# Patient Record
Sex: Female | Born: 1935 | Race: White | Hispanic: No | Smoking: Former smoker
Health system: Southern US, Community
[De-identification: ages and names within clinical notes are randomized; demographics above are authoritative.]

## PROBLEM LIST (undated history)

## (undated) DIAGNOSIS — R413 Other amnesia: Secondary | ICD-10-CM

## (undated) DIAGNOSIS — Z9289 Personal history of other medical treatment: Secondary | ICD-10-CM

## (undated) DIAGNOSIS — K219 Gastro-esophageal reflux disease without esophagitis: Secondary | ICD-10-CM

## (undated) DIAGNOSIS — Z972 Presence of dental prosthetic device (complete) (partial): Secondary | ICD-10-CM

## (undated) DIAGNOSIS — Z86010 Personal history of colonic polyps: Secondary | ICD-10-CM

## (undated) DIAGNOSIS — G309 Alzheimer's disease, unspecified: Secondary | ICD-10-CM

## (undated) DIAGNOSIS — IMO0001 Reserved for inherently not codable concepts without codable children: Secondary | ICD-10-CM

## (undated) DIAGNOSIS — M810 Age-related osteoporosis without current pathological fracture: Secondary | ICD-10-CM

## (undated) DIAGNOSIS — Z8719 Personal history of other diseases of the digestive system: Secondary | ICD-10-CM

## (undated) DIAGNOSIS — E785 Hyperlipidemia, unspecified: Secondary | ICD-10-CM

## (undated) DIAGNOSIS — F0280 Dementia in other diseases classified elsewhere without behavioral disturbance: Secondary | ICD-10-CM

## (undated) DIAGNOSIS — I2699 Other pulmonary embolism without acute cor pulmonale: Secondary | ICD-10-CM

## (undated) HISTORY — PX: HEMORROIDECTOMY: SUR656

## (undated) HISTORY — DX: Hyperlipidemia, unspecified: E78.5

## (undated) HISTORY — DX: Personal history of other medical treatment: Z92.89

## (undated) HISTORY — DX: Other pulmonary embolism without acute cor pulmonale: I26.99

## (undated) HISTORY — DX: Personal history of other diseases of the digestive system: Z87.19

## (undated) HISTORY — DX: Age-related osteoporosis without current pathological fracture: M81.0

## (undated) HISTORY — PX: APPENDECTOMY: SHX54

## (undated) HISTORY — DX: Other amnesia: R41.3

## (undated) HISTORY — DX: Reserved for inherently not codable concepts without codable children: IMO0001

## (undated) HISTORY — DX: Personal history of colonic polyps: Z86.010

---

## 1997-08-30 ENCOUNTER — Ambulatory Visit (HOSPITAL_COMMUNITY): Admission: RE | Admit: 1997-08-30 | Discharge: 1997-08-30 | Payer: Self-pay | Admitting: Gastroenterology

## 1998-10-14 ENCOUNTER — Other Ambulatory Visit: Admission: RE | Admit: 1998-10-14 | Discharge: 1998-10-14 | Payer: Self-pay | Admitting: Family Medicine

## 2000-12-15 ENCOUNTER — Other Ambulatory Visit: Admission: RE | Admit: 2000-12-15 | Discharge: 2000-12-15 | Payer: Self-pay | Admitting: Family Medicine

## 2001-02-08 ENCOUNTER — Ambulatory Visit (HOSPITAL_COMMUNITY): Admission: RE | Admit: 2001-02-08 | Discharge: 2001-02-08 | Payer: Self-pay | Admitting: Gastroenterology

## 2001-02-08 ENCOUNTER — Encounter (INDEPENDENT_AMBULATORY_CARE_PROVIDER_SITE_OTHER): Payer: Self-pay | Admitting: *Deleted

## 2003-02-28 ENCOUNTER — Other Ambulatory Visit: Admission: RE | Admit: 2003-02-28 | Discharge: 2003-02-28 | Payer: Self-pay | Admitting: Family Medicine

## 2004-05-19 ENCOUNTER — Ambulatory Visit (HOSPITAL_COMMUNITY): Admission: RE | Admit: 2004-05-19 | Discharge: 2004-05-19 | Payer: Self-pay | Admitting: Gastroenterology

## 2008-03-08 ENCOUNTER — Encounter: Payer: Self-pay | Admitting: Family Medicine

## 2009-06-04 ENCOUNTER — Encounter: Payer: Self-pay | Admitting: Cardiology

## 2009-06-05 DIAGNOSIS — Z9289 Personal history of other medical treatment: Secondary | ICD-10-CM | POA: Insufficient documentation

## 2009-06-05 HISTORY — DX: Personal history of other medical treatment: Z92.89

## 2009-09-19 ENCOUNTER — Encounter: Payer: Self-pay | Admitting: Family Medicine

## 2010-01-19 ENCOUNTER — Encounter: Payer: Self-pay | Admitting: Family Medicine

## 2010-03-03 ENCOUNTER — Encounter: Payer: Self-pay | Admitting: Family Medicine

## 2010-03-03 LAB — HM COLONOSCOPY

## 2010-05-19 ENCOUNTER — Ambulatory Visit
Admission: RE | Admit: 2010-05-19 | Discharge: 2010-05-19 | Payer: Self-pay | Source: Home / Self Care | Attending: Family Medicine | Admitting: Family Medicine

## 2010-05-19 ENCOUNTER — Other Ambulatory Visit: Payer: Self-pay | Admitting: Family Medicine

## 2010-05-19 DIAGNOSIS — R413 Other amnesia: Secondary | ICD-10-CM | POA: Insufficient documentation

## 2010-05-19 DIAGNOSIS — E785 Hyperlipidemia, unspecified: Secondary | ICD-10-CM

## 2010-05-19 DIAGNOSIS — Z860100 Personal history of colon polyps, unspecified: Secondary | ICD-10-CM | POA: Insufficient documentation

## 2010-05-19 DIAGNOSIS — Z8719 Personal history of other diseases of the digestive system: Secondary | ICD-10-CM | POA: Insufficient documentation

## 2010-05-19 DIAGNOSIS — M810 Age-related osteoporosis without current pathological fracture: Secondary | ICD-10-CM

## 2010-05-19 HISTORY — DX: Hyperlipidemia, unspecified: E78.5

## 2010-05-19 HISTORY — DX: Age-related osteoporosis without current pathological fracture: M81.0

## 2010-05-19 LAB — LIPID PANEL
Cholesterol: 169 mg/dL (ref 0–200)
HDL: 54.8 mg/dL (ref >39.00)
LDL Cholesterol: 100 mg/dL — ABNORMAL HIGH (ref 0–99)
Total CHOL/HDL Ratio: 3
Triglycerides: 71 mg/dL (ref 0.0–149.0)
VLDL: 14.2 mg/dL (ref 0.0–40.0)

## 2010-05-19 LAB — HEPATIC FUNCTION PANEL
ALT: 18 U/L (ref 0–35)
AST: 23 U/L (ref 0–37)
Albumin: 3.8 g/dL (ref 3.5–5.2)
Alkaline Phosphatase: 44 U/L (ref 39–117)
Bilirubin, Direct: 0.1 mg/dL (ref 0.0–0.3)
Total Bilirubin: 0.7 mg/dL (ref 0.3–1.2)
Total Protein: 6.7 g/dL (ref 6.0–8.3)

## 2010-05-19 LAB — BASIC METABOLIC PANEL
BUN: 15 mg/dL (ref 6–23)
CO2: 29 mEq/L (ref 19–32)
Calcium: 9.6 mg/dL (ref 8.4–10.5)
Chloride: 105 mEq/L (ref 96–112)
Creatinine, Ser: 1 mg/dL (ref 0.4–1.2)
GFR: 56.13 mL/min — ABNORMAL LOW (ref >60.00)
Glucose, Bld: 93 mg/dL (ref 70–99)
Potassium: 4.1 mEq/L (ref 3.5–5.1)
Sodium: 141 mEq/L (ref 135–145)

## 2010-06-04 ENCOUNTER — Encounter: Payer: Self-pay | Admitting: Family Medicine

## 2010-06-11 ENCOUNTER — Encounter: Payer: Self-pay | Admitting: Family Medicine

## 2010-06-12 NOTE — Assessment & Plan Note (Addendum)
Summary: NEW MEDICARE PT   Vital Signs:  Patient profile:   77 year old female Height:      63.75 inches Weight:      172.25 pounds BMI:     29.91 Temp:     98 degrees F oral Pulse rate:   80 / minute Pulse rhythm:   regular BP sitting:   144 / 84  (left arm) Cuff size:   regular  Vitals Entered By: Delilah Shan CMA  Dull) (May 19, 2010 10:02 AM) CC: New Medicare Patient to Establish   History of Present Illness: Needs to est with new MD.   Elevated Cholesterol: Using medications without problems:yes Muscle aches: no Other complaints:no  Osteoporosis- On evista and tolerating this well.  Requesting records.    Memory loss- prev started on donepezil and has done well.  Dec in forgetfulness per patient.  Living alone and independent with activity/driving/cooking.  2/3 on recall today for short term memory.  O/w recall here to day is normal (long term dates).  No motor changes per patient.   Preventive Screening-Counseling & Management  Alcohol-Tobacco     Smoking Status: quit  Caffeine-Diet-Exercise     Does Patient Exercise: yes      Drug Use:  no.    Allergies (verified): No Known Drug Allergies  Past History:  Past Medical History: HYPERLIPIDEMIA (ICD-272.4) DIVERTICULITIS, HX OF (ICD-V12.79) COLONIC POLYPS, HX OF (ICD-V12.72) MEMORY LOSS (ICD-780.93) OSTEOPOROSIS (ICD-733.00)  Past Surgical History: Appendectomy Hemorrhoidectomy  Family History: Reviewed history and no changes required. Family History Hypertension all sibs dead, all sisters, last one died 10/06/2009 1 sib with CVA, 1 with brain tumor, 1 with PNA Mother dead, HTN Father dead, melanoma  Social History: Reviewed history and no changes required. Retired from [employer], Designer, television/film set From [place], [place] Education:  2022/10/07 Widow as of Oct 06, 2009, lives alone Former Smoker, quit in 10/07/78 Alcohol use-no Drug use-no Regular exercise-yes, [employer] enjoys crafts, bowling, church/senior activity 1  son, JohnSmoking Status:  quit Drug Use:  no Does Patient Exercise:  yes  Review of Systems       See HPI.  Otherwise negative.    Physical Exam  General:  GEN: nad, alert and oriented, pleasant HEENT: mucous membranes moist NECK: supple w/o LA CV: rrr PULM: ctab, no inc wob ABD: soft, +bs EXT: no edema SKIN: no acute rash    Impression & Recommendations:  Problem # 1:  MEMORY LOSS (ICD-780.93) No change in meds.  Appears to be stable.  Requesting records.    Problem # 2:  OSTEOPOROSIS (ICD-733.00) No change in meds.  Requesting records.   Her updated medication list for this problem includes:    Evista 60 Mg Tabs (Raloxifene hcl) .Marland Kitchen... Take 1 tablet by mouth once a day    Vitamin D 400 Unit Tabs (Cholecalciferol) ..... Softgel 1.25 mg once daily    Calcium Carbonate-vitamin D 600-400 Mg-unit Tabs (Calcium carbonate-vitamin d) .Marland Kitchen... Take 1 tablet by mouth two times a day  Problem # 3:  HYPERLIPIDEMIA (ICD-272.4) No change in meds.  See notes on labs.  Her updated medication list for this problem includes:    Lipitor 20 Mg Tabs (Atorvastatin calcium) .Marland Kitchen... Take 1 tablet by mouth once a day    Lovaza 1 Gm Caps (Omega-3-acid ethyl esters) .Marland Kitchen... Take 2 capsules two times a day  Orders: TLB-BMP (Basic Metabolic Panel-BMET) (80048-METABOL) TLB-Hepatic/Liver Function Pnl (80076-HEPATIC) TLB-Lipid Panel (80061-LIPID)  Complete Medication List: 1)  Donepezil Hcl 10 Mg Tabs (Donepezil  hcl) .... Take 1 tablet by mouth once a day 2)  Lipitor 20 Mg Tabs (Atorvastatin calcium) .... Take 1 tablet by mouth once a day 3)  Omeprazole 20 Mg Cpdr (Omeprazole) .... Take 1 tablet by mouth once a day 4)  Evista 60 Mg Tabs (Raloxifene hcl) .... Take 1 tablet by mouth once a day 5)  Vitamin B-12 500 Mcg Tabs (Cyanocobalamin) .Marland Kitchen.. 100 mg. once daily 6)  Vitamin D 400 Unit Tabs (Cholecalciferol) .... Softgel 1.25 mg once daily 7)  Lovaza 1 Gm Caps (Omega-3-acid ethyl esters) .... Take 2  capsules two times a day 8)  Multivitamins Tabs (Multiple vitamin) .... Once daily 9)  Calcium Carbonate-vitamin D 600-400 Mg-unit Tabs (Calcium carbonate-vitamin d) .... Take 1 tablet by mouth two times a day 10)  Potassium Gluconate 2 Meq Tabs (Potassium gluconate) .... Once daily 11)  Glucosamine-chondroitin Caps (Glucosamine-chondroit-vit c-mn) .... 2,000 mg. once daily 12)  Vitamin C 500 Mg Tabs (Ascorbic acid) .... 1,000 mg. once daily 13)  Stool Softener 100 Mg Caps (Docusate sodium) .... Once daily  Patient Instructions: 1)  Let us know when you need refills on your medicines.  We'll request your records in the meantime.  We'll contact you with your lab report.  2)  Plan on 6 month OV, with fasting cmet/lipid (272.0) and TSH/vitamin D (733.0) before the visit. 3)  Take care.  Glad to see you today.  Prescriptions: POTASSIUM GLUCONATE 2 MEQ TABS (POTASSIUM GLUCONATE) once daily  #90 x 3   Entered and Authorized by:   Crawford Givens MD   Signed by:   Crawford Givens MD on 05/19/2010   Method used:   Print then Give to Patient   RxID:   [id number] LOVAZA 1 GM CAPS (OMEGA-3-ACID ETHYL ESTERS) Take 2 capsules two times a day  #360 x 3   Entered and Authorized by:   Crawford Givens MD   Signed by:   Crawford Givens MD on 05/19/2010   Method used:   Print then Give to Patient   RxID:   [id number] EVISTA 60 MG TABS (RALOXIFENE HCL) Take 1 tablet by mouth once a day  #90 x 3   Entered and Authorized by:   Crawford Givens MD   Signed by:   Crawford Givens MD on 05/19/2010   Method used:   Print then Give to Patient   RxID:   [id number] OMEPRAZOLE 20 MG CPDR (OMEPRAZOLE) Take 1 tablet by mouth once a day  #90 x 3   Entered and Authorized by:   Crawford Givens MD   Signed by:   Crawford Givens MD on 05/19/2010   Method used:   Print then Give to Patient   RxID:   [id number] LIPITOR 20 MG TABS (ATORVASTATIN CALCIUM) Take 1 tablet by mouth once a day  #90 x 3   Entered and  Authorized by:   Crawford Givens MD   Signed by:   Crawford Givens MD on 05/19/2010   Method used:   Print then Give to Patient   RxID:   [id number] DONEPEZIL HCL 10 MG TABS (DONEPEZIL HCL) Take 1 tablet by mouth once a day  #90 x 3   Entered and Authorized by:   Crawford Givens MD   Signed by:   Crawford Givens MD on 05/19/2010   Method used:   Print then Give to Patient   RxID:   [id number]    Orders Added: 1)  New Patient Level III [09323] 2)  TLB-BMP (  Basic Metabolic Panel-BMET) [80048-METABOL] 3)  TLB-Hepatic/Liver Function Pnl [80076-HEPATIC] 4)  TLB-Lipid Panel [80061-LIPID]    Current Allergies (reviewed today): No known allergies      Preventive Care Screening  Colonoscopy:    Date:  04/10/2010    Results:  Done    Appended Document: NEW MEDICARE PT    Clinical Lists Changes  Observations: Added new observation of PAST MED HX: HYPERLIPIDEMIA (ICD-272.4) DIVERTICULITIS, HX OF (ICD-V12.79) COLONIC POLYPS, HX OF (ICD-V12.72) MEMORY LOSS (ICD-780.93) OSTEOPOROSIS (ICD-733.00)- lack of response to boniva, started on evista 12/11, due for repeat DXA 2013  (06/04/2010 11:58) Added new observation of DM PROGRESS: N/A (06/04/2010 11:58) Added new observation of DM FSREVIEW: N/A (06/04/2010 11:58) Added new observation of HTN PROGRESS: N/A (06/04/2010 11:58) Added new observation of HTN FSREVIEW: N/A (06/04/2010 11:58)         Past History:  Past Medical History: HYPERLIPIDEMIA (ICD-272.4) DIVERTICULITIS, HX OF (ICD-V12.79) COLONIC POLYPS, HX OF (ICD-V12.72) MEMORY LOSS (ICD-780.93) OSTEOPOROSIS (ICD-733.00)- lack of response to boniva, started on evista 12/11, due for repeat DXA 2013

## 2010-06-12 NOTE — Letter (Signed)
Summary: [facility]  [facility]   Imported By: Maryln Gottron 06/02/2010 13:53:10  _____________________________________________________________________  External Attachment:    Type:   Image     Comment:   External Document  Appended Document: [facility] See if you can get the actual colonoscopy report.  Thanks.   Appended Document: [facility] Requested by fax.  Appended Document: [facility] Reports have arrived, in your in box.  Appended Document: [facility] will review.

## 2010-06-12 NOTE — Letter (Signed)
Summary: Records from [facility] 2008 - 2009  Records from [facility] 2008 - 2009   Imported By: Maryln Gottron 05/30/2010 14:55:39  _____________________________________________________________________  External Attachment:    Type:   Image     Comment:   External Document

## 2010-06-18 NOTE — Miscellaneous (Signed)
Clinical Lists Changes  Observations: Added new observation of COLONNXTDUE: 04/2015 (06/11/2010 10:01) Added new observation of COLONOSCOPY: Adenomatous Polyp (03/03/2010 10:02)      Preventive Care Screening  Colonoscopy:    Date:  03/03/2010    Next Due:  04/2015    Results:  Adenomatous Polyp     Small internal hemorrhoids. Scattered sigmoid diverticula One small sessile rectosigmoid polyp removed by cold biopsy x 1. Otherwise normal colonoscopy up to the terminal ileum.  Appended Document:     Clinical Lists Changes  Observations: Added new observation of DEXANXTDUE: 12/2011 (06/11/2010 10:04) Added new observation of MAMMO DUE: 12/2010 (06/11/2010 10:04) Added new observation of PAST MED HX: HYPERLIPIDEMIA (ICD-272.4) DIVERTICULITIS, HX OF (ICD-V12.79) COLONIC POLYPS, HX OF (ICD-V12.72) MEMORY LOSS (ICD-780.93) OSTEOPOROSIS (ICD-733.00)- lack of response to boniva, started on evista 12/11, due for repeat DXA 2013 06/05/2009   Stress test - [facility]               Echo  - mild AS/mod. LVH.  Myocardial perfusion imaging is normal.  Overall left ventricular function is                                                            normal without regional wall motion abnormalities.  (06/11/2010 10:04) Added new observation of VIT D 25-OH: 75.8 ng/mL (02/27/2010 10:04) Added new observation of HGBA1C: 5.9 % (02/27/2010 10:04) Added new observation of SGPT (ALT): 19 units/L (02/27/2010 10:04) Added new observation of SGOT (AST): 22 units/L (02/27/2010 10:04) Added new observation of CREATININE: 0.96 mg/dL (91/47/8295 62:13) Added new observation of BG RANDOM: 101 mg/dL (08/65/7846 96:29) Added new observation of LDL: 87 mg/dL (52/84/1324 40:10) Added new observation of HDL: 54 mg/dL (27/25/3664 40:34) Added new observation of TRIGLYC TOT: 69 mg/dL (74/25/9563 87:56) Added new observation of CHOLESTEROL: 155 mg/dL (43/32/9518 84:16) Added new observation of BONE  DENSITY: osteoporosis std dev (12/17/2009 10:21) Added new observation of MAMMOGRAM: normal  (12/17/2009 10:21) Added new observation of HGBA1C: 6.1 % (08/19/2009 10:04) Added new observation of SGPT (ALT): 22 units/L (08/19/2009 10:04) Added new observation of SGOT (AST): 22 units/L (08/19/2009 10:04) Added new observation of CREATININE: 1.12 mg/dL (60/63/0160 10:93) Added new observation of BG RANDOM: 90 mg/dL (23/55/7322 02:54) Added new observation of VLDL: 16 mg/dL (27/10/2374 28:31) Added new observation of LDL: 95 mg/dL (51/76/1607 37:10) Added new observation of HDL: 52 mg/dL (62/69/4854 62:70) Added new observation of TRIGLYC TOT: 80 mg/dL (35/00/9381 82:99) Added new observation of CHOLESTEROL: 163 mg/dL (37/16/9678 93:81) Added new observation of VIT D 25-OH: 44.8 ng/mL (08/19/2009 10:04) Added new observation of TD BOOSTER: Td  (09/07/2008 10:08) Added new observation of PAP SMEAR: normal  (09/03/2008 10:14) Added new observation of VIT D 25-OH: 40.2 ng/mL (08/24/2008 10:04) Added new observation of HGBA1C: 6.1 % (08/24/2008 10:04) Added new observation of CREATININE: 0.98 mg/dL (01/75/1025 85:27) Added new observation of BG RANDOM: 89 mg/dL (78/24/2353 61:44) Added new observation of PNEUMOVAX: given  (05/11/2005 10:08)       Past History:  Past Medical History: HYPERLIPIDEMIA (ICD-272.4) DIVERTICULITIS, HX OF (ICD-V12.79) COLONIC POLYPS, HX OF (ICD-V12.72) MEMORY LOSS (ICD-780.93) OSTEOPOROSIS (ICD-733.00)- lack of response to boniva, started on evista 12/11, due for repeat DXA 2013 06/05/2009   Stress test - [facility]  Echo  - mild AS/mod. LVH.  Myocardial perfusion imaging is normal.  Overall left ventricular function is                                                            normal without regional wall motion abnormalities.    Preventive Care Screening  Bone Density:    Date:  12/17/2009    Next Due:  12/2011    Results:  osteoporosis  std dev  Mammogram:    Date:  12/17/2009    Next Due:  12/2010    Results:  normal   Last Tetanus Booster:    Date:  09/07/2008    Results:  Td   Pap Smear:    Date:  09/03/2008    Results:  normal   Last Pneumovax:    Date:  05/11/2005    Results:  given

## 2010-06-26 NOTE — Letter (Signed)
Summary: Historic Patient File  Historic Patient File   Imported By: Kassie Mends 06/17/2010 10:40:10  _____________________________________________________________________  External Attachment:    Type:   Image     Comment:   External Document

## 2010-06-26 NOTE — Procedures (Signed)
Summary: Colonoscopy  Colonoscopy   Imported By: Kassie Mends 06/17/2010 10:43:48  _____________________________________________________________________  External Attachment:    Type:   Image     Comment:   External Document

## 2010-06-26 NOTE — Letter (Signed)
Summary: Dr Elmer Picker Ophthamology Evaluation  Dr Elmer Picker Ophthamology Evaluation   Imported By: Kassie Mends 06/16/2010 11:40:24  _____________________________________________________________________  External Attachment:    Type:   Image     Comment:   External Document

## 2010-09-26 NOTE — Procedures (Signed)
[facility]  Patient:    Katrina Armstrong, Katrina Armstrong. Visit Number: [id number] MRN: [id number]          Service Type: Attending:  Anselmo Rod, M.D. Dictated by:   Anselmo Rod, M.D. Proc. Date: 02/08/01   CC:         Talmadge Coventry, M.D.   Procedure Report  DATE OF BIRTH:  [birth year].  PROCEDURE:  Colonoscopy with snare polypectomy x 2.  ENDOSCOPIST:  Anselmo Rod, M.D.  INSTRUMENT USED:  Olympus video colonoscope.  INDICATION FOR PROCEDURE:  A 78 year old white female with a history of colonic polyps seen at 20 cm.  Rule out other polyps, and polypectomy planned.  PREPROCEDURE PREPARATION:  Informed consent was procured from the patient. The patient was fasted for eight hours prior to the procedure and prepped with a bottle of magnesium citrate and a gallon of NuLytely the night prior to the procedure.  PREPROCEDURE PHYSICAL:  VITAL SIGNS:  The patient had stable vital signs.  NECK:  Supple.  CHEST:  Clear to auscultation.  S1, S2 regular.  ABDOMEN:  Soft with normal bowel sounds.  DESCRIPTION OF PROCEDURE:  The patient was placed in the left lateral decubitus position and sedated with 70 mcg of fentanyl and 6 mg of Versed intravenously.  Once the patient was adequately sedate and maintained on low-flow oxygen and continuous cardiac monitoring, the Olympus video colonoscope was advanced from the rectum to the cecum without difficulty.  The patient had a fairly good prep.  A 7-8 mm polyp was snared from 25 cm.  A smaller polyp was removed from 35 cm.  This was snared as well.  The patient had left-sided diverticulosis.  There were small internal and external hemorrhoids appreciated on retroflexion and anal inspection, respectively. The rest of the colonic mucosa up to the cecum appeared normal.  The cecum, right colon, transverse colon was healthy and without lesions.  IMPRESSION: 1. Small, nonbleeding internal and external  hemorrhoid. 2. Left-sided diverticulosis. 3. Two polyps snared from the left colon. 4. Normal-appearing transverse colon, right colon, and cecum.  RECOMMENDATIONS: 1. Await pathology results. 2. Avoid all nonsteroidals. 3. Outpatient follow-up in the next two weeks for further recommendations. Dictated by:   Anselmo Rod, M.D. Attending:  Anselmo Rod, M.D. DD:  02/08/01 TD:  02/08/01 Job: [id number]

## 2010-09-26 NOTE — Op Note (Signed)
Katrina Armstrong, Katrina Armstrong                ACCOUNT NO.:  [id number]   MEDICAL RECORD NO.:  [id number]          PATIENT TYPE:  AMB   LOCATION:  ENDO                         FACILITY:  [facility]   PHYSICIAN:  Anselmo Rod, M.D.  DATE OF BIRTH:  [birth year]   DATE OF PROCEDURE:  05/19/2004  DATE OF DISCHARGE:                                 OPERATIVE REPORT   PROCEDURE PERFORMED:  Screening colonoscopy.   ENDOSCOPIST:  Anselmo Rod, M.D.   INSTRUMENT USED:  Olympus video colonoscope.   INDICATIONS FOR PROCEDURE:  A 77 year old white female with a personal  history of colonic polyps and occasional BRBPR, undergoing a screening  colonoscopy to rule out colonic polyps, masses, etc.   PRE-PROCEDURE PREPARATION:  Informed consent was procured from the patient.  The patient was fasted for eight hours prior to the procedure and prepped  with a bottle of magnesium citrate and a gallon of GoLYTELY the night prior  to the procedure.   PRE-PROCEDURE PHYSICAL:  VITAL SIGNS:  The patient had stable vital signs.  NECK:  Supple.  CHEST:  Clear to auscultation.  CARDIOVASCULAR:  S1, S2 regular.  ABDOMEN:  Soft, with normal bowel sounds.   DESCRIPTION OF THE PROCEDURE:  The patient was placed in the left lateral  decubitus position, sedated with 70 mg of Demerol and 7.5 mg of Versed in  slow incremental doses.  Once the patient was adequately sedated and  maintained on low-flow oxygen and continuous cardiac monitoring, the Olympus  video colonoscope was advanced into the rectum to the cecum.  The  appendiceal orifice and the ileocecal valve were clearly visualized and  photographed.  A prominent external hemorrhoid was noted on anal inspection.  Small internal hemorrhoids were seen on retroflexion.  There was evidence of  left-sided diverticulosis, with stool in several of the diverticula.  No  masses or polyps were seen. The appendiceal orifice and ileocecal valve were  clearly visualized and  photographed.  The patient tolerated the procedure  well, without immediate complications.   IMPRESSION:  1.  Prominent external hemorrhoid.  2.  Small internal hemorrhoids.  3.  Sigmoid diverticulosis.  4.  No masses or polyps seen.   RECOMMENDATIONS:  1.  Continue a high-fiber diet, with liberal fluid intake.  Brochures on      diverticulosis have been given to the patient      for education.  2.  Repeat colonoscopy in the next five years unless the patient develops      abnormal symptoms in the interim.  3.  Outpatient followup in the next two weeks to discuss a possible      hemorrhoidectomy.      Jyot   JNM/MEDQ  D:  05/19/2004  T:  05/19/2004  Job:  [id number]   cc:   Talmadge Coventry, M.D.  [address]  [place]  [place] [address]  Fax: [phone]

## 2010-11-11 ENCOUNTER — Other Ambulatory Visit: Payer: Self-pay | Admitting: Family Medicine

## 2010-11-11 DIAGNOSIS — M810 Age-related osteoporosis without current pathological fracture: Secondary | ICD-10-CM

## 2010-11-11 DIAGNOSIS — E785 Hyperlipidemia, unspecified: Secondary | ICD-10-CM

## 2010-11-12 ENCOUNTER — Encounter: Payer: Self-pay | Admitting: Family Medicine

## 2010-11-13 ENCOUNTER — Other Ambulatory Visit (INDEPENDENT_AMBULATORY_CARE_PROVIDER_SITE_OTHER): Payer: Medicare Other | Admitting: Family Medicine

## 2010-11-13 DIAGNOSIS — M810 Age-related osteoporosis without current pathological fracture: Secondary | ICD-10-CM

## 2010-11-13 DIAGNOSIS — E785 Hyperlipidemia, unspecified: Secondary | ICD-10-CM

## 2010-11-13 LAB — LIPID PANEL
Cholesterol: 166 mg/dL (ref 0–200)
HDL: 55.9 mg/dL (ref >39.00)
Triglycerides: 53 mg/dL (ref 0.0–149.0)
VLDL: 10.6 mg/dL (ref 0.0–40.0)

## 2010-11-13 LAB — COMPREHENSIVE METABOLIC PANEL
Albumin: 3.9 g/dL (ref 3.5–5.2)
Alkaline Phosphatase: 49 U/L (ref 39–117)
BUN: 16 mg/dL (ref 6–23)
Glucose, Bld: 104 mg/dL — ABNORMAL HIGH (ref 70–99)
Potassium: 3.9 mEq/L (ref 3.5–5.1)
Total Bilirubin: 0.7 mg/dL (ref 0.3–1.2)

## 2010-11-13 LAB — TSH: TSH: 3.24 u[IU]/mL (ref 0.35–5.50)

## 2010-11-14 LAB — VITAMIN D 25 HYDROXY (VIT D DEFICIENCY, FRACTURES): Vit D, 25-Hydroxy: 71 ng/mL (ref 30–89)

## 2010-11-18 ENCOUNTER — Ambulatory Visit (INDEPENDENT_AMBULATORY_CARE_PROVIDER_SITE_OTHER): Payer: Medicare Other | Admitting: Family Medicine

## 2010-11-18 ENCOUNTER — Encounter: Payer: Self-pay | Admitting: Family Medicine

## 2010-11-18 DIAGNOSIS — M810 Age-related osteoporosis without current pathological fracture: Secondary | ICD-10-CM

## 2010-11-18 DIAGNOSIS — R413 Other amnesia: Secondary | ICD-10-CM

## 2010-11-18 DIAGNOSIS — E785 Hyperlipidemia, unspecified: Secondary | ICD-10-CM

## 2010-11-18 MED ORDER — ATORVASTATIN CALCIUM 20 MG PO TABS: 20.0000 mg | ORAL_TABLET | ORAL | Status: DC

## 2010-11-18 MED ORDER — DONEPEZIL HCL 10 MG PO TABS: 10.0000 mg | ORAL_TABLET | Freq: Every day | ORAL | Status: DC

## 2010-11-18 MED ORDER — OMEGA-3-ACID ETHYL ESTERS 1 G PO CAPS: 2.0000 g | ORAL_CAPSULE | Freq: Two times a day (BID) | ORAL | Status: DC

## 2010-11-18 MED ORDER — RALOXIFENE HCL 60 MG PO TABS: 60.0000 mg | ORAL_TABLET | Freq: Every day | ORAL | Status: DC

## 2010-11-18 MED ORDER — OMEPRAZOLE 20 MG PO CPDR: 20.0000 mg | DELAYED_RELEASE_CAPSULE | Freq: Every day | ORAL | Status: DC

## 2010-11-18 NOTE — Assessment & Plan Note (Signed)
Controlled , no change in meds 

## 2010-11-18 NOTE — Progress Notes (Signed)
Elevated Cholesterol: Using medications without problems:yes Muscle aches: no Other complaints:no Exercising 5x/week at [employer].   Labs d/w pt.    Memory changes.  -1 on pentagons.  3/3 on recall.  Occ trouble with names of people, but this is long standing.  29/30 on MMSE today.   Meeting with old hospice group several times a month for social support.    Osteoporosis:  Prev DXA done- compliant with meds and no ADE.   PMH and SH reviewed  Meds, vitals, and allergies reviewed.   ROS: See HPI.  Otherwise negative.    GEN: nad, alert and oriented HEENT: mucous membranes moist NECK: supple w/o LA CV: rrr. PULM: ctab, no inc wob ABD: soft, +bs SKIN: no acute rash

## 2010-11-18 NOTE — Assessment & Plan Note (Addendum)
Compliant with meds with normal vit D.  Continue as is.  Consider repeat DXA in 2013.

## 2010-11-18 NOTE — Patient Instructions (Signed)
Don't change your meds.  Take care.  I would get a flu shot each fall.   Let me know if you have any concerns.   Schedule a 30 min follow up appointment in 6 months.  No labs ahead of time.   Glad to see you today.

## 2010-11-19 ENCOUNTER — Encounter: Payer: Self-pay | Admitting: Family Medicine

## 2010-12-08 ENCOUNTER — Encounter: Payer: Self-pay | Admitting: Family Medicine

## 2010-12-08 ENCOUNTER — Inpatient Hospital Stay (HOSPITAL_COMMUNITY)
Admission: EM | Admit: 2010-12-08 | Discharge: 2010-12-18 | DRG: 280 | Disposition: A | Discharge status: Home / Self Care | Payer: Medicare Other | Attending: Cardiology | Admitting: Cardiology

## 2010-12-08 ENCOUNTER — Emergency Department (HOSPITAL_COMMUNITY): Payer: Medicare Other

## 2010-12-08 ENCOUNTER — Encounter (HOSPITAL_COMMUNITY): Payer: Self-pay | Admitting: Radiology

## 2010-12-08 ENCOUNTER — Telehealth: Payer: Self-pay | Admitting: *Deleted

## 2010-12-08 DIAGNOSIS — R791 Abnormal coagulation profile: Secondary | ICD-10-CM | POA: Diagnosis present

## 2010-12-08 DIAGNOSIS — I2510 Atherosclerotic heart disease of native coronary artery without angina pectoris: Secondary | ICD-10-CM | POA: Diagnosis present

## 2010-12-08 DIAGNOSIS — I2699 Other pulmonary embolism without acute cor pulmonale: Secondary | ICD-10-CM | POA: Diagnosis present

## 2010-12-08 DIAGNOSIS — I214 Non-ST elevation (NSTEMI) myocardial infarction: Secondary | ICD-10-CM | POA: Insufficient documentation

## 2010-12-08 DIAGNOSIS — K219 Gastro-esophageal reflux disease without esophagitis: Secondary | ICD-10-CM | POA: Diagnosis present

## 2010-12-08 DIAGNOSIS — E785 Hyperlipidemia, unspecified: Secondary | ICD-10-CM | POA: Diagnosis present

## 2010-12-08 DIAGNOSIS — I498 Other specified cardiac arrhythmias: Secondary | ICD-10-CM | POA: Diagnosis present

## 2010-12-08 DIAGNOSIS — I82409 Acute embolism and thrombosis of unspecified deep veins of unspecified lower extremity: Secondary | ICD-10-CM | POA: Diagnosis present

## 2010-12-08 HISTORY — DX: Dementia in other diseases classified elsewhere, unspecified severity, without behavioral disturbance, psychotic disturbance, mood disturbance, and anxiety: G30.9

## 2010-12-08 HISTORY — DX: Dementia in other diseases classified elsewhere, unspecified severity, without behavioral disturbance, psychotic disturbance, mood disturbance, and anxiety: F02.80

## 2010-12-08 HISTORY — DX: Non-ST elevation (NSTEMI) myocardial infarction: I21.4

## 2010-12-08 LAB — DIFFERENTIAL
Eosinophils Relative: 1 % (ref 0–5)
Lymphocytes Relative: 35 % (ref 12–46)
Lymphs Abs: 3.6 10*3/uL (ref 0.7–4.0)
Monocytes Absolute: 0.7 10*3/uL (ref 0.1–1.0)
Monocytes Relative: 6 % (ref 3–12)

## 2010-12-08 LAB — CBC
HCT: 39.7 % (ref 36.0–46.0)
MCH: 29.4 pg (ref 26.0–34.0)
MCV: 86.5 fL (ref 78.0–100.0)
RDW: 14.4 % (ref 11.5–15.5)
WBC: 10.3 10*3/uL (ref 4.0–10.5)

## 2010-12-08 LAB — BASIC METABOLIC PANEL
GFR calc Af Amer: >60 mL/min (ref >60)
GFR calc non Af Amer: >60 mL/min (ref >60)
Glucose, Bld: 105 mg/dL — ABNORMAL HIGH (ref 70–99)
Potassium: 4.6 mEq/L (ref 3.5–5.1)
Sodium: 142 mEq/L (ref 135–145)

## 2010-12-08 LAB — PROTIME-INR: Prothrombin Time: 12.8 seconds (ref 11.6–15.2)

## 2010-12-08 LAB — URINALYSIS, ROUTINE W REFLEX MICROSCOPIC
Hgb urine dipstick: NEGATIVE
Nitrite: NEGATIVE
Specific Gravity, Urine: 1.013 (ref 1.005–1.030)
Urobilinogen, UA: 0.2 mg/dL (ref 0.0–1.0)

## 2010-12-08 LAB — MRSA PCR SCREENING: MRSA by PCR: NEGATIVE

## 2010-12-08 LAB — CK TOTAL AND CKMB (NOT AT ARMC): CK, MB: 4.5 ng/mL — ABNORMAL HIGH (ref 0.3–4.0)

## 2010-12-08 LAB — APTT: aPTT: 26 seconds (ref 24–37)

## 2010-12-08 NOTE — Telephone Encounter (Signed)
Called patient after advising Dr. Para March of this and was advised that she has called 911 and they are on their way.

## 2010-12-08 NOTE — Telephone Encounter (Signed)
Patient called complaining of numbness all over and some SOB that started last night, but has gotten worse this morning. Advised patient that she needs to call 911 and go to the ER to be evaluated.

## 2010-12-08 NOTE — Telephone Encounter (Signed)
Noted  

## 2010-12-09 DIAGNOSIS — I2510 Atherosclerotic heart disease of native coronary artery without angina pectoris: Secondary | ICD-10-CM

## 2010-12-09 LAB — CARDIAC PANEL(CRET KIN+CKTOT+MB+TROPI)
Relative Index: 3.3 — ABNORMAL HIGH (ref 0.0–2.5)
Relative Index: 2.6 — ABNORMAL HIGH (ref 0.0–2.5)
Total CK: 144 U/L (ref 7–177)
Total CK: 151 U/L (ref 7–177)
Troponin I: 0.66 ng/mL (ref <0.30)
Troponin I: 0.55 ng/mL (ref <0.30)

## 2010-12-09 LAB — LIPID PANEL
Cholesterol: 195 mg/dL (ref 0–200)
LDL Cholesterol: 126 mg/dL — ABNORMAL HIGH (ref 0–99)
Triglycerides: 72 mg/dL (ref <150)

## 2010-12-09 LAB — CBC
MCHC: 34 g/dL (ref 30.0–36.0)
RDW: 14.3 % (ref 11.5–15.5)

## 2010-12-09 LAB — BASIC METABOLIC PANEL
Calcium: 8.8 mg/dL (ref 8.4–10.5)
GFR calc non Af Amer: >60 mL/min (ref >60)
Sodium: 139 mEq/L (ref 135–145)

## 2010-12-09 LAB — POCT ACTIVATED CLOTTING TIME: Activated Clotting Time: 94 seconds

## 2010-12-09 LAB — HEPARIN LEVEL (UNFRACTIONATED): Heparin Unfractionated: 0.39 IU/mL (ref 0.30–0.70)

## 2010-12-09 LAB — HEMOGLOBIN A1C: Mean Plasma Glucose: 128 mg/dL — ABNORMAL HIGH (ref <117)

## 2010-12-09 LAB — D-DIMER, QUANTITATIVE: D-Dimer, Quant: 3.52 ug/mL-FEU — ABNORMAL HIGH (ref 0.00–0.48)

## 2010-12-10 ENCOUNTER — Inpatient Hospital Stay (HOSPITAL_COMMUNITY): Payer: Medicare Other

## 2010-12-10 DIAGNOSIS — I359 Nonrheumatic aortic valve disorder, unspecified: Secondary | ICD-10-CM

## 2010-12-10 DIAGNOSIS — I2699 Other pulmonary embolism without acute cor pulmonale: Secondary | ICD-10-CM

## 2010-12-10 LAB — BASIC METABOLIC PANEL
BUN: 10 mg/dL (ref 6–23)
CO2: 25 mEq/L (ref 19–32)
Chloride: 108 mEq/L (ref 96–112)
GFR calc non Af Amer: >60 mL/min (ref >60)
Glucose, Bld: 102 mg/dL — ABNORMAL HIGH (ref 70–99)
Potassium: 4 mEq/L (ref 3.5–5.1)
Sodium: 140 mEq/L (ref 135–145)

## 2010-12-10 LAB — CARDIAC PANEL(CRET KIN+CKTOT+MB+TROPI)
CK, MB: 2.9 ng/mL (ref 0.3–4.0)
CK, MB: 2.8 ng/mL (ref 0.3–4.0)
Relative Index: 3.2 — ABNORMAL HIGH (ref 0.0–2.5)
Relative Index: INVALID (ref 0.0–2.5)
Total CK: 94 U/L (ref 7–177)
Troponin I: <0.3 ng/mL (ref <0.30)
Troponin I: <0.3 ng/mL (ref <0.30)

## 2010-12-10 LAB — CBC
HCT: 35.7 % — ABNORMAL LOW (ref 36.0–46.0)
Hemoglobin: 11.8 g/dL — ABNORMAL LOW (ref 12.0–15.0)
MCHC: 33.1 g/dL (ref 30.0–36.0)
RBC: 4.09 MIL/uL (ref 3.87–5.11)

## 2010-12-10 MED ORDER — IOHEXOL 300 MG/ML  SOLN
100.0000 mL | Freq: Once | INTRAMUSCULAR | Status: AC | PRN
  Administered 2010-12-10: 13:00:00 100 mL via INTRAVENOUS

## 2010-12-11 LAB — CBC
HCT: 32.8 % — ABNORMAL LOW (ref 36.0–46.0)
MCH: 29 pg (ref 26.0–34.0)
MCHC: 33.2 g/dL (ref 30.0–36.0)
MCV: 87.2 fL (ref 78.0–100.0)
Platelets: 179 10*3/uL (ref 150–400)
RDW: 14.7 % (ref 11.5–15.5)

## 2010-12-12 LAB — PROTIME-INR
INR: 1.03 (ref 0.00–1.49)
Prothrombin Time: 13.7 seconds (ref 11.6–15.2)

## 2010-12-12 LAB — CBC
MCH: 28.5 pg (ref 26.0–34.0)
MCHC: 32.5 g/dL (ref 30.0–36.0)
MCV: 87.6 fL (ref 78.0–100.0)
Platelets: 219 10*3/uL (ref 150–400)
RBC: 4.11 MIL/uL (ref 3.87–5.11)

## 2010-12-13 LAB — HEPARIN LEVEL (UNFRACTIONATED): Heparin Unfractionated: 0.64 IU/mL (ref 0.30–0.70)

## 2010-12-13 LAB — CBC
HCT: 33.4 % — ABNORMAL LOW (ref 36.0–46.0)
MCH: 28.7 pg (ref 26.0–34.0)
MCV: 87.9 fL (ref 78.0–100.0)
Platelets: 187 10*3/uL (ref 150–400)
RBC: 3.8 MIL/uL — ABNORMAL LOW (ref 3.87–5.11)
RDW: 14.8 % (ref 11.5–15.5)
WBC: 8.6 10*3/uL (ref 4.0–10.5)

## 2010-12-14 ENCOUNTER — Encounter: Payer: Self-pay | Admitting: Family Medicine

## 2010-12-14 LAB — CBC
HCT: 35.3 % — ABNORMAL LOW (ref 36.0–46.0)
Hemoglobin: 11.3 g/dL — ABNORMAL LOW (ref 12.0–15.0)
MCV: 88 fL (ref 78.0–100.0)
WBC: 9.3 10*3/uL (ref 4.0–10.5)

## 2010-12-14 LAB — HEPARIN LEVEL (UNFRACTIONATED): Heparin Unfractionated: 0.68 IU/mL (ref 0.30–0.70)

## 2010-12-14 LAB — PROTIME-INR: INR: 1.13 (ref 0.00–1.49)

## 2010-12-15 LAB — PROTIME-INR
INR: 1.27 (ref 0.00–1.49)
Prothrombin Time: 16.2 seconds — ABNORMAL HIGH (ref 11.6–15.2)

## 2010-12-15 LAB — CBC
HCT: 35.9 % — ABNORMAL LOW (ref 36.0–46.0)
Hemoglobin: 11.7 g/dL — ABNORMAL LOW (ref 12.0–15.0)
RBC: 4.08 MIL/uL (ref 3.87–5.11)

## 2010-12-15 LAB — HEPARIN LEVEL (UNFRACTIONATED): Heparin Unfractionated: 0.47 IU/mL (ref 0.30–0.70)

## 2010-12-16 LAB — BASIC METABOLIC PANEL
BUN: 9 mg/dL (ref 6–23)
Calcium: 8.9 mg/dL (ref 8.4–10.5)
Creatinine, Ser: 0.87 mg/dL (ref 0.50–1.10)
GFR calc Af Amer: >60 mL/min (ref >60)
GFR calc non Af Amer: >60 mL/min (ref >60)
Potassium: 3.9 mEq/L (ref 3.5–5.1)

## 2010-12-16 LAB — CBC
HCT: 34.5 % — ABNORMAL LOW (ref 36.0–46.0)
MCH: 28.6 pg (ref 26.0–34.0)
MCHC: 32.5 g/dL (ref 30.0–36.0)
RDW: 15.1 % (ref 11.5–15.5)

## 2010-12-16 LAB — PROTIME-INR: INR: 1.61 — ABNORMAL HIGH (ref 0.00–1.49)

## 2010-12-17 LAB — CBC
Hemoglobin: 12.3 g/dL (ref 12.0–15.0)
MCH: 28.9 pg (ref 26.0–34.0)
MCHC: 32.7 g/dL (ref 30.0–36.0)
Platelets: 221 10*3/uL (ref 150–400)
RDW: 15.2 % (ref 11.5–15.5)

## 2010-12-17 LAB — PROTIME-INR: Prothrombin Time: 24.3 seconds — ABNORMAL HIGH (ref 11.6–15.2)

## 2010-12-17 LAB — HEPARIN LEVEL (UNFRACTIONATED): Heparin Unfractionated: 0.53 IU/mL (ref 0.30–0.70)

## 2010-12-18 DIAGNOSIS — I2699 Other pulmonary embolism without acute cor pulmonale: Secondary | ICD-10-CM

## 2010-12-18 LAB — BASIC METABOLIC PANEL
BUN: 10 mg/dL (ref 6–23)
CO2: 30 mEq/L (ref 19–32)
Calcium: 9.5 mg/dL (ref 8.4–10.5)
GFR calc non Af Amer: >60 mL/min (ref >60)
Glucose, Bld: 96 mg/dL (ref 70–99)

## 2010-12-18 LAB — CBC
MCH: 28.7 pg (ref 26.0–34.0)
MCHC: 32.5 g/dL (ref 30.0–36.0)
MCV: 88.3 fL (ref 78.0–100.0)
Platelets: 228 10*3/uL (ref 150–400)
RDW: 15.2 % (ref 11.5–15.5)

## 2010-12-18 LAB — PROTIME-INR: Prothrombin Time: 28 seconds — ABNORMAL HIGH (ref 11.6–15.2)

## 2010-12-21 ENCOUNTER — Encounter: Payer: Self-pay | Admitting: Family Medicine

## 2010-12-21 ENCOUNTER — Telehealth: Payer: Self-pay | Admitting: Family Medicine

## 2010-12-21 DIAGNOSIS — I2699 Other pulmonary embolism without acute cor pulmonale: Secondary | ICD-10-CM | POA: Insufficient documentation

## 2010-12-21 NOTE — Telephone Encounter (Signed)
I got a note that pt was admitted with CP, had a PE.  Please get discharge summary from hospital and call pt about setting f/u OV, thanks.

## 2010-12-22 ENCOUNTER — Ambulatory Visit (INDEPENDENT_AMBULATORY_CARE_PROVIDER_SITE_OTHER): Payer: Medicare Other | Admitting: Family Medicine

## 2010-12-22 DIAGNOSIS — I2699 Other pulmonary embolism without acute cor pulmonale: Secondary | ICD-10-CM

## 2010-12-22 DIAGNOSIS — Z7901 Long term (current) use of anticoagulants: Secondary | ICD-10-CM

## 2010-12-22 DIAGNOSIS — Z5181 Encounter for therapeutic drug level monitoring: Secondary | ICD-10-CM

## 2010-12-22 NOTE — Telephone Encounter (Signed)
Hospital follow-up appointment scheduled for 12/24/10. Hospital discharge summary is not available yet.  Will get discharge summary when it is available.

## 2010-12-22 NOTE — Patient Instructions (Signed)
7.5 mg today and tomorrow, recheck wed

## 2010-12-24 ENCOUNTER — Ambulatory Visit (INDEPENDENT_AMBULATORY_CARE_PROVIDER_SITE_OTHER): Payer: Medicare Other | Admitting: Family Medicine

## 2010-12-24 ENCOUNTER — Other Ambulatory Visit: Payer: Self-pay | Admitting: *Deleted

## 2010-12-24 ENCOUNTER — Encounter: Payer: Self-pay | Admitting: Family Medicine

## 2010-12-24 VITALS — BP 146/78 | HR 68 | Temp 98.1°F | Wt 173.0 lb

## 2010-12-24 DIAGNOSIS — I2699 Other pulmonary embolism without acute cor pulmonale: Secondary | ICD-10-CM

## 2010-12-24 DIAGNOSIS — Z7901 Long term (current) use of anticoagulants: Secondary | ICD-10-CM

## 2010-12-24 DIAGNOSIS — Z5181 Encounter for therapeutic drug level monitoring: Secondary | ICD-10-CM

## 2010-12-24 DIAGNOSIS — I214 Non-ST elevation (NSTEMI) myocardial infarction: Secondary | ICD-10-CM

## 2010-12-24 LAB — POCT INR: INR: 1.6

## 2010-12-24 MED ORDER — WARFARIN SODIUM 5 MG PO TABS: 5.0000 mg | ORAL_TABLET | Freq: Every day | ORAL | Status: DC

## 2010-12-24 MED ORDER — FUROSEMIDE 20 MG PO TABS: 20.0000 mg | ORAL_TABLET | Freq: Every day | ORAL | Status: DC | PRN

## 2010-12-24 NOTE — H&P (Signed)
Katrina Armstrong, Katrina Armstrong                ACCOUNT NO.:  [id number]  MEDICAL RECORD NO.:  [id number]  LOCATION:  2610                         FACILITY:  [facility]  PHYSICIAN:  Jesse Sans. Diyana Starrett, MD, FACCDATE OF BIRTH:  1928-08-04  DATE OF ADMISSION:  12/08/2010 DATE OF DISCHARGE:                             HISTORY & PHYSICAL   PRIMARY CARDIOLOGIST:  [facility] being seen by Dr. Daleen Squibb.  PRIMARY CARE PROVIDER:  Dr. Para March at [facility].  PATIENT PROFILE:  A 77 year old female without prior cardiac history who presented to the ED with a 24-hour history of exertional dyspnea and bilateral arm and hand numbness and found to have elevated troponin.  PROBLEM LIST: 1. Non-ST-segment elevation myocardial infarction. 2. Hyperlipidemia. 3. GERD. 4. History of memory loss on Aricept. 5. Remote tobacco abuse.     a.     She smoked half pack a day for approximately 40 years and      quit 18 years ago. 6. Osteoporosis. 7. History of colon polyps. 8. History of diverticulitis.  ALLERGIES:  No known drug allergies.  HISTORY OF PRESENT ILLNESS:  A 77 year old female without prior cardiac history.  She was in usual state of health until yesterday morning when while at church she noted significant decrease in exercise tolerance and overall energy.  She did not have any get up and go and is having difficulty with the repetitive sitting and standing at this customary with the [facility].  At the church she continued to feel weak and noted dyspnea on exertion, bilateral arm and hand numbness lasting about 5-10 minutes and resolving with rest.  These symptoms occurred the remainder of the day yesterday with any sort of activity and even occurred periodically throughout the night last night.  She came into the ED this morning secondary to recurrent dyspnea and associated arm and hand numbness and here ECG shows lateral Qs but otherwise no acute changes.  Troponin's elevated at  0.89.  She is currently symptom free.  HOME MEDICATIONS: 1. Aspirin 3 tablets over-the-counter as needed. 2. Vitamin D3 1000 units 2 tablets daily. 3. Vitamin C 1000 mg daily. 4. Vitamin B12 100 mcg daily. 5. Evista 60 mg daily. 6. Potassium chloride 20 mEq daily. 7. Prilosec 20 mg daily. 8. Lovaza 1 gram 2 capsules b.i.d. 9. Multivitamin 1 tablet daily. 10.Glucosamine chondroitin 1 tablet daily. 11.Aricept 10 mg at bedtime. 12.Lipitor 20 mg daily. 13.Docusate 100 mg daily. 14.Caltrate 600 plus D b.i.d.  FAMILY HISTORY:  Mother died of hypertension.  Father died with history of melanoma.  She had siblings that have died of CVA, brain tumor, and pneumonia.  SOCIAL HISTORY:  The patient lives in [place] by herself.  She is retired.  She smoked half-pack a day for approximately 40 years and quit 18 years ago.  She denies alcohol or drugs.  She works out at [employer] about 5 days a week.  REVIEW OF SYSTEMS:  Positive for dyspnea on exertion with bilateral arm and hand numbness.  She denies chest pain.  Otherwise all systems reviewed and negative.  She is a full code.  PHYSICAL EXAMINATION:  VITAL SIGNS:  Temperature 98.0, heart  rate 78, respirations 18, blood pressure 152/76, pulse ox 99% on room air. GENERAL:  Pleasant white female in no acute distress.  Awake, alert, and oriented x3.  She has normal affect. HEENT:  Normal.  Nares grossly intact and nonfocal. SKIN:  Warm, dry without lesions or masses. NECK:  Supple without bruits or JVD. LUNGS:  Respirations are regular and unlabored, clear to auscultation. CARDIAC:  Regular S1, S2.  No S3, S4, or murmurs. ABDOMEN:  Round, soft, nontender, nondistended.  Bowel sounds present x4. EXTREMITIES:  Warm, dry, pink.  No clubbing, cyanosis, or edema. Dorsalis pedis, posterior tibial pulses 1+ and equal bilaterally.  Right radial is 2+ with normal Allen test.  Head CT showed no acute intracranial bleed/mass lesion/acute  infarct. Diffuse atrophy and small-vessel ischemic changes.  Chest x-ray shows mild right lung base opacity likely scarring or atelectasis.  No definitive acute process.  EKG shows sinus rhythm, rate of 67, small Qs in lead I and aVL.  Hemoglobin 13.5, hematocrit 39.7, WBC 10.3, platelets 193,000.  Sodium 142, potassium 4.6, chloride 106, CO2 29, BUN 13, creatinine 0.83, glucose 105, BNP 413, CK 165, MB 4.5, troponin-I 0.89.  Urinalysis negative.  ASSESSMENT/PLAN: 1. Non-ST-segment elevation myocardial infarction.  The patient     presents to the ED with a 24-hour history of exertional dyspnea and     bilateral arm numbness which likely represents her anginal     equivalent.  ECG is nonacute and troponin is elevated at 0.89.     Plan to admit and continue to cycle cardiac markers.  We have     discussed the indication risk and benefits of cardiac     catheterization.  The patient is agreeable to proceed.  We will     plan this for the morning or sooner if the patient has     recurrent/recalcitrant symptoms.  We will add aspirin, high-dose     statin, beta-blocker, heparin, and nitrate therapy. 2. Hypertension.  The patient does not have prior documented history     of hypertension.  Blood pressures are elevated here in the ED.  We     will add beta-blocker and follow up. 3. Hyperlipidemia.  Check lipids and LFTs.  Continue statin therapy. 4. History of memory loss, continue Aricept. 5. Gastroesophageal reflux disease.  Continue PPI.     Nicolasa Ducking, ANP   ______________________________ Jesse Sans. Daleen Squibb, MD, [facility]    CB/MEDQ  D:  12/08/2010  T:  12/09/2010  Job:  [id number]  Electronically Signed by Nicolasa Ducking ANP on 12/09/2010 07:34:40 PM Electronically Signed by Valera Castle MD [facility] on 12/24/2010 10:39:36 AM

## 2010-12-24 NOTE — Telephone Encounter (Signed)
Discharge summary presented to Dr. Para March.

## 2010-12-24 NOTE — Assessment & Plan Note (Signed)
Nontoxic, anticoagulated.  Path/phys d/w pt and she's aware of interactions and diet cautions.  Not bleeding, breathing well.  Will plan for 6 months coumadin and then consider d/c.  She agrees with plan.  >25 min spent with face to face with patient, >50% counseling. I do think that she'll be able to comply with coumadin with the memory changes.  She understands her condition today, is able to recall the hospital events and understands the plan.  She had scored well on MMSE prev.

## 2010-12-24 NOTE — Progress Notes (Signed)
F/u for CP/sob at hospital.  Antecedent R ankle edema that has self resolved and then returned.  She was cathed at [facility].  She had PE on CT angio and DVT in leg.  Now on coumadin.  Feeling better except for some mild rhinorrhea.  She was a little light headed yesterday, but this self resolved.    On coumadin now and her INR was low today.  See notes on anticoag visit.  We talked about this today.  We discussed anticoag/PE path/phys today.  No FH of clotting disorder.  She had prev been active but has had some prolonged sitting at home.  She was on raloxifene.  This has been stopped.    PMH and SH reviewed  ROS: See HPI, otherwise noncontributory.  Meds, vitals, and allergies reviewed.   Current Outpatient Prescriptions on File Prior to Visit  Medication Sig Dispense Refill  . Calcium Carb-Cholecalciferol 600-400 MG-UNIT TABS Take 1 tablet by mouth 2 (two) times daily.        Marland Kitchen docusate sodium (STOOL SOFTENER) 100 MG capsule Take 100 mg by mouth daily.        Marland Kitchen donepezil (ARICEPT) 10 MG tablet Take 1 tablet (10 mg total) by mouth daily.  90 tablet  3  . Glucosamine-Chondroit-Vit C-Mn (GLUCOSAMINE-CHONDROITIN) CAPS 2000 mg once daily       . Multiple Vitamin (MULTIVITAMIN PO) Take 1 tablet by mouth daily.        Marland Kitchen omega-3 acid ethyl esters (LOVAZA) 1 G capsule Take 2 capsules (2 g total) by mouth 2 (two) times daily.  360 capsule  3  . omeprazole (PRILOSEC) 20 MG capsule Take 1 capsule (20 mg total) by mouth daily.  90 capsule  3  . Potassium Gluconate 2 MEQ TABS Take 1 tablet by mouth daily.        . vitamin B-12 (CYANOCOBALAMIN) 100 MCG tablet Take 50 mcg by mouth daily.        . vitamin C (ASCORBIC ACID) 500 MG tablet Take 1,000 mg by mouth daily.        Marland Kitchen VITAMIN D, CHOLECALCIFEROL, PO softgel 1.25 mg once daily       . atorvastatin (LIPITOR) 20 MG tablet Take 1 tablet (20 mg total) by mouth every other day.  45 tablet  3   GEN: nad, alert and oriented HEENT: mucous membranes moist NECK:  supple w/o LA CV: rrr.  PULM: ctab, no inc wob ABD: soft, +bs EXT: no edema SKIN: no acute rash

## 2010-12-24 NOTE — Patient Instructions (Signed)
7.5 mg x 2 days and recheck on Friday

## 2010-12-24 NOTE — Patient Instructions (Signed)
I would start back on the lipitor.  Stop the evista.  I'd like to see you back in 3 months, sooner if needed.  Schedule a visit.  Come back to see Terri on Friday.  Take care.  Glad to see you.

## 2010-12-25 NOTE — Discharge Summary (Signed)
[name], WERY NO.:  [id number]  MEDICAL RECORD NO.:  [id number]  LOCATION:  3714                         FACILITY:  [facility]  PHYSICIAN:  Arturo Morton. Riley Kill, MD, FACCDATE OF BIRTH:  18-May-1928  DATE OF ADMISSION:  12/08/2010 DATE OF DISCHARGE:  12/18/2010                              DISCHARGE SUMMARY   PROCEDURES: 1. Cardiac catheterization. 2. Coronary arteriogram. 3. Left ventriculogram. 4. CT angiogram of the chest. 5. CT of the head without contrast. 6. Two-view chest x-ray. 7. 2-D echocardiogram.  PRIMARY FINAL DISCHARGE DIAGNOSIS:  Pulmonary embolism and right lower extremity deep vein thrombosis.  SECONDARY DIAGNOSES: 1. Minimal nonobstructive coronary artery disease at cath. 2. Preserved left ventricular function with an ejection fraction of     60% at cath, ejection fraction 55-60% at echo. 3. Elevated pulmonary artery pressures with a PAS of 43 by echo (no     PFO seen). 4. Anticoagulation with Coumadin initiated this admission. 5. Status post colonoscopy with polypectomy. 6. Non-ST segment elevation myocardial infarction by cardiac enzymes,     felt to be type 2 secondary to pulmonary embolism. 7. Hyperlipidemia with a total cholesterol of 195, triglycerides 72,     HDL 55, LDL 126 this admission. 8. History of sinus bradycardia in the 40s and 50s, asymptomatic. 9. History of lower extremity edema treated with Lasix. 10.Remote history of tobacco use. 11.History of memory loss on Aricept. 12.Gastroesophageal reflux disease. 13.History of osteoporosis.  Time at discharge, 38 minutes.  HOSPITAL COURSE:  Katrina Armstrong is an 77 year old female with no previous history of coronary artery disease.  She came to the hospital where her cardiac enzymes were elevated indicating a non-ST segment elevation MI. She was admitted for further evaluation and treatment.  She was anticoagulated.  Dr. Riley Kill did a cardiac catheterization on December 09, 2010, but found no obstructive disease.  Her LAD had a 20-30% lesion.  Her EF was normal.  He recommended checking a D-dimer and an echocardiogram.  The D-dimer was elevated, so a CT angiogram of the chest was performed which showed bilateral acute pulmonary embolism. She was continued on heparin and Coumadin was initiated.  Lower extremity Dopplers were performed which showed an acute DVT in the right lower extremity from mid SFV to posterior tibialis, peroneal vein in the mid calf.  There was no evidence of superficial thrombus or Baker cyst and the left lower extremity had no DVT.  She developed some edema which was treated with Lasix and improved.  Her Coumadin level was followed closely and it was felt that she need a 24- hour overlap with a therapeutic Coumadin level before heparin could be discontinued.  She was continued on low-dose Lasix.  By December 18, 2010, her INR had been greater than 2.  Her BMET was stable.  On December 18, 2010, Dr. Riley Kill considered [name] stable for discharge, to be followed closely as an outpatient.  DISCHARGE INSTRUCTIONS:  Her activity level is to be increased gradually.  She is not to do any lifting for 4 weeks.  She is not to do any driving for a week.  She is not to have any sexual activity  for a week.  She is encouraged to stick to a low-sodium heart-healthy diet. She is to follow up with Tereso Newcomer for Dr. Daleen Squibb on August 30 at 11:00 a.m. and with Dr. Para March and call for an appointment.  She is to follow up with a Coumadin check at [facility] on Monday.  DISCHARGE MEDICATIONS: 1. Coumadin 5 mg 1-1/2 tablets daily or as directed. 2. Lovaza 2 capsules b.i.d. 3. Tylenol 325 mg 1 or 2 tablets q.4 h. P.r.n. 4. Docusate 100 mg a day. 5. Evista 60 mg daily. 6. Lipitor 20 mg daily. 7. Multivitamins daily. 8. Aricept 10 mg a day. 9. Glucosamine daily. 10.Prilosec daily. 11.Potassium 20 mEq a day. 12.Aspirin is  discontinued. 13.Caltrate plus D b.i.d. 14.Cyanocobalamin 100 mcg daily. 15.Vitamin C 1000 mg daily. 16.Vitamin D 1000 mg 2 tablets daily. 17.Lasix 20 mg daily p.r.n.    Theodore Demark, PA-C   ______________________________ Arturo Morton. Riley Kill, MD, [facility]   RB/MEDQ  D:  12/18/2010  T:  12/18/2010  Job:  [id number]  cc:   Dr. Para March  Electronically Signed by Theodore Demark PA-C on 12/22/2010 11:08:55 AM Electronically Signed by Shawnie Pons MD [facility] on 12/25/2010 09:53:06 AM

## 2010-12-25 NOTE — Cardiovascular Report (Signed)
Katrina Armstrong, Katrina Armstrong                ACCOUNT NO.:  [id number]  MEDICAL RECORD NO.:  [id number]  LOCATION:  6526                         FACILITY:  [facility]  PHYSICIAN:  Arturo Morton. Riley Kill, MD, FACCDATE OF BIRTH:  24-Nov-1928  DATE OF PROCEDURE: DATE OF DISCHARGE:                           CARDIAC CATHETERIZATION   INDICATIONS:  Katrina Armstrong is an 77 year old woman who presents with chest pain.  She had mildly positive troponins.  Her electrocardiogram demonstrates no demonstrable dynamic changes, although she does have T- wave inversion in V1.  She was seen by Dr. Daleen Squibb and set up for diagnostic cardiac catheterization.  PROCEDURES: 1. Left heart catheterization 2. Selective coronary arteriography. 3. Selective left ventriculography.  DESCRIPTION OF PROCEDURE:  The procedure was performed from the right femoral artery using 4-French catheters and she tolerated the procedure well.  There were no major complications.  She was taken to the holding area in satisfactory clinical condition for sheath removal.  HEMODYNAMIC DATA: 1. The central aortic pressure is 138/66, mean 95. 2. LV pressure 137/10. 3. There was no gradient or pullback across the aortic valve.  ANGIOGRAPHIC DATA: 1. The left main is short, but large in caliber and free of critical     disease. 2. The left anterior descending artery courses to the apex.  There are     several small diagonals and septal perforators.  There is some     calcification in the left anterior descending artery, and some     evidence of segmental plaque of less than 30% in the midportion of     the vessel after the takeoff of from the 2 septal perforators.     However none of this appears to be highly obstructive.  The distal     vessel is without critical disease. 3. The circumflex is a large-caliber vessel that provides a     bifurcating marginal, and a second marginal of modest size.  The     circumflex appears to be smooth throughout  without critical     narrowing. 4. The right coronary artery may have some calcification in the aortic     root near the origin.  However no evidence of high-grade     obstruction is noted.  There is a fairly moderate size posterior     descending branch which bifurcates than the posterolateral system     which is relatively small in caliber. 5. Ventriculography was done in the RAO projection.  No definite wall     motion abnormalities were seen.  Ejection fraction estimate would     be in the range of 60-65%.  CONCLUSIONS: 1. Normal left ventricular function without wall motion abnormality. 2. Normal left ventricular end-diastolic pressure during pressure     measurement. 3. Mild calcification in the left anterior descending artery without     high-grade obstructive disease. 4. No significant high-grade focal coronary obstructions.  DISPOSITION:  The patient clearly has positive troponins.  There are no definite EKG changes.  We will repeat her EKG in the morning.  There is very little to suggest takotsubo.  We will get a D-dimer to exclude the underlying pulmonary emboli as a source of  both chest pain and elevated troponins.  We may also get a 2-D echo.     Arturo Morton. Riley Kill, MD, [facility]     TDS/MEDQ  D:  12/09/2010  T:  12/10/2010  Job:  [id number]  cc:   Seve Monette C. Daleen Squibb, MD, [facility] Dwana Curd. Para March, M.D. CV Laboratory  Electronically Signed by Shawnie Pons MD [facility] on 12/25/2010 09:53:03 AM

## 2010-12-26 ENCOUNTER — Ambulatory Visit (INDEPENDENT_AMBULATORY_CARE_PROVIDER_SITE_OTHER): Payer: Medicare Other | Admitting: Family Medicine

## 2010-12-26 DIAGNOSIS — Z5181 Encounter for therapeutic drug level monitoring: Secondary | ICD-10-CM

## 2010-12-26 DIAGNOSIS — I2699 Other pulmonary embolism without acute cor pulmonale: Secondary | ICD-10-CM

## 2010-12-26 DIAGNOSIS — Z7901 Long term (current) use of anticoagulants: Secondary | ICD-10-CM

## 2010-12-26 NOTE — Patient Instructions (Signed)
7.5 mg daily, recheck in 1 week.  

## 2010-12-26 NOTE — Progress Notes (Signed)
i'd like [place] to recheck sooner - Tuesday if able.

## 2010-12-30 ENCOUNTER — Ambulatory Visit (INDEPENDENT_AMBULATORY_CARE_PROVIDER_SITE_OTHER): Payer: Medicare Other | Admitting: Family Medicine

## 2010-12-30 DIAGNOSIS — Z7901 Long term (current) use of anticoagulants: Secondary | ICD-10-CM

## 2010-12-30 DIAGNOSIS — Z5181 Encounter for therapeutic drug level monitoring: Secondary | ICD-10-CM

## 2010-12-30 DIAGNOSIS — I2699 Other pulmonary embolism without acute cor pulmonale: Secondary | ICD-10-CM

## 2010-12-30 NOTE — Patient Instructions (Addendum)
7.5 mg daily, except Tue Thur Sat take10 mg (increased by 7.5 mg weekly) recheck in 1 wee

## 2011-01-02 ENCOUNTER — Ambulatory Visit: Payer: Medicare Other

## 2011-01-06 ENCOUNTER — Ambulatory Visit (INDEPENDENT_AMBULATORY_CARE_PROVIDER_SITE_OTHER): Payer: Medicare Other | Admitting: Family Medicine

## 2011-01-06 DIAGNOSIS — Z7901 Long term (current) use of anticoagulants: Secondary | ICD-10-CM

## 2011-01-06 DIAGNOSIS — Z5181 Encounter for therapeutic drug level monitoring: Secondary | ICD-10-CM

## 2011-01-06 DIAGNOSIS — I2699 Other pulmonary embolism without acute cor pulmonale: Secondary | ICD-10-CM

## 2011-01-06 LAB — POCT INR: INR: 1.5

## 2011-01-06 NOTE — Patient Instructions (Signed)
Increase to 10 mg daily, 7.5 mg Thurs, Sun, recheck 1 week

## 2011-01-07 ENCOUNTER — Telehealth: Payer: Self-pay | Admitting: Family Medicine

## 2011-01-07 ENCOUNTER — Encounter: Payer: Self-pay | Admitting: Family Medicine

## 2011-01-07 NOTE — Telephone Encounter (Signed)
Patient advised and states she already has the follow up appt scheduled.

## 2011-01-07 NOTE — Telephone Encounter (Signed)
Please call pt.  Mammogram looked okay, she needs 6 months follow up about benign appearing calcifications.  Thanks.

## 2011-01-08 ENCOUNTER — Encounter: Payer: Medicare Other | Admitting: Physician Assistant

## 2011-01-13 ENCOUNTER — Ambulatory Visit (INDEPENDENT_AMBULATORY_CARE_PROVIDER_SITE_OTHER): Payer: Medicare Other | Admitting: Family Medicine

## 2011-01-13 DIAGNOSIS — Z7901 Long term (current) use of anticoagulants: Secondary | ICD-10-CM

## 2011-01-13 DIAGNOSIS — Z5181 Encounter for therapeutic drug level monitoring: Secondary | ICD-10-CM

## 2011-01-13 DIAGNOSIS — I2699 Other pulmonary embolism without acute cor pulmonale: Secondary | ICD-10-CM

## 2011-01-13 LAB — POCT INR: INR: 1.7

## 2011-01-13 NOTE — Patient Instructions (Signed)
continue 10 mg daily, 7.5 mg Thurs, Sun, recheck 1 week

## 2011-01-20 ENCOUNTER — Ambulatory Visit: Payer: Medicare Other

## 2011-01-20 DIAGNOSIS — Z5181 Encounter for therapeutic drug level monitoring: Secondary | ICD-10-CM

## 2011-01-20 DIAGNOSIS — Z7901 Long term (current) use of anticoagulants: Secondary | ICD-10-CM

## 2011-01-20 DIAGNOSIS — I2699 Other pulmonary embolism without acute cor pulmonale: Secondary | ICD-10-CM

## 2011-01-20 LAB — POCT INR: INR: 1.9

## 2011-02-03 ENCOUNTER — Ambulatory Visit (INDEPENDENT_AMBULATORY_CARE_PROVIDER_SITE_OTHER): Payer: Medicare Other | Admitting: Family Medicine

## 2011-02-03 DIAGNOSIS — Z7901 Long term (current) use of anticoagulants: Secondary | ICD-10-CM

## 2011-02-03 DIAGNOSIS — I2699 Other pulmonary embolism without acute cor pulmonale: Secondary | ICD-10-CM

## 2011-02-03 DIAGNOSIS — Z5181 Encounter for therapeutic drug level monitoring: Secondary | ICD-10-CM

## 2011-02-03 NOTE — Patient Instructions (Signed)
continue 10 mg daily, 7.5 mg Thurs, recheck 2 week

## 2011-02-16 ENCOUNTER — Ambulatory Visit (INDEPENDENT_AMBULATORY_CARE_PROVIDER_SITE_OTHER): Payer: Medicare Other | Admitting: Family Medicine

## 2011-02-16 DIAGNOSIS — I2699 Other pulmonary embolism without acute cor pulmonale: Secondary | ICD-10-CM

## 2011-02-16 DIAGNOSIS — Z7901 Long term (current) use of anticoagulants: Secondary | ICD-10-CM

## 2011-02-16 DIAGNOSIS — Z5181 Encounter for therapeutic drug level monitoring: Secondary | ICD-10-CM

## 2011-02-16 LAB — POCT INR: INR: 2.4

## 2011-02-16 NOTE — Patient Instructions (Signed)
Continue current dose, check in 4 weeks  

## 2011-02-17 ENCOUNTER — Ambulatory Visit: Payer: Medicare Other

## 2011-03-11 ENCOUNTER — Encounter: Payer: Self-pay | Admitting: Family Medicine

## 2011-03-11 ENCOUNTER — Ambulatory Visit (INDEPENDENT_AMBULATORY_CARE_PROVIDER_SITE_OTHER): Payer: Medicare Other | Admitting: Family Medicine

## 2011-03-11 DIAGNOSIS — R413 Other amnesia: Secondary | ICD-10-CM

## 2011-03-11 DIAGNOSIS — Z5181 Encounter for therapeutic drug level monitoring: Secondary | ICD-10-CM

## 2011-03-11 DIAGNOSIS — Z7901 Long term (current) use of anticoagulants: Secondary | ICD-10-CM

## 2011-03-11 DIAGNOSIS — I2699 Other pulmonary embolism without acute cor pulmonale: Secondary | ICD-10-CM

## 2011-03-11 DIAGNOSIS — L659 Nonscarring hair loss, unspecified: Secondary | ICD-10-CM

## 2011-03-11 NOTE — Patient Instructions (Signed)
I'll work on [employer].  You can get your results through our phone system.  Follow the instructions on the blue card.  Let me know if the anxiety or hair loss gets worse. Take care.

## 2011-03-11 NOTE — Patient Instructions (Signed)
Continue current dose, check in 4 weeks  

## 2011-03-11 NOTE — Progress Notes (Signed)
Anxiety.  She is a widow, since 10/09/09.  His daughter inherited everything per patient.  Per patient, now lawyers are involved and it's been expensive for patient to process.  It's been stressful having to document her finances.  "I stay a nervous wreck."  Some insomnia.  Better if she has some time away from the lawyers but "I'm going to have to go to court."  She's noted her memory to be less reliable under periods of stress.  "When I get nervous I can't think at all."  "I'm not lying, it's just that I may remember it a different way the next time."  She was asking for a letter to document her prev memory changes.    Hair loss.  Recent with clot and had to be anticoagulated on coumadin.  hair change seemed to start after illness.  No h/o hair loss prev in past.  The hair is coming out, not breaking. "Coming out by the handfull."    Meds, vitals, and allergies reviewed.   ROS: See HPI.  Otherwise, noncontributory.  ncat scalp wnl except for diffusely thinning hair Neck supple, no LA, no TMG rrr ctab Ext w/o edema MMSE 29/30 today

## 2011-03-12 DIAGNOSIS — L659 Nonscarring hair loss, unspecified: Secondary | ICD-10-CM | POA: Insufficient documentation

## 2011-03-12 LAB — COMPREHENSIVE METABOLIC PANEL
ALT: 22 U/L (ref 0–35)
AST: 26 U/L (ref 0–37)
Albumin: 3.9 g/dL (ref 3.5–5.2)
Alkaline Phosphatase: 53 U/L (ref 39–117)
Potassium: 4.2 mEq/L (ref 3.5–5.1)
Sodium: 140 mEq/L (ref 135–145)
Total Bilirubin: 0.5 mg/dL (ref 0.3–1.2)
Total Protein: 7.3 g/dL (ref 6.0–8.3)

## 2011-03-12 LAB — CBC WITH DIFFERENTIAL/PLATELET
Basophils Absolute: 0.1 10*3/uL (ref 0.0–0.1)
Eosinophils Relative: 0.8 % (ref 0.0–5.0)
HCT: 38.7 % (ref 36.0–46.0)
Lymphs Abs: 4.5 10*3/uL — ABNORMAL HIGH (ref 0.7–4.0)
MCV: 89.1 fl (ref 78.0–100.0)
Monocytes Absolute: 0.6 10*3/uL (ref 0.1–1.0)
Neutrophils Relative %: 45.7 % (ref 43.0–77.0)
Platelets: 222 10*3/uL (ref 150.0–400.0)
RDW: 16.3 % — ABNORMAL HIGH (ref 11.5–14.6)
WBC: 9.6 10*3/uL (ref 4.5–10.5)

## 2011-03-12 NOTE — Assessment & Plan Note (Signed)
Likely worse under periods of anxiety.  Will write letter.  She 'll let me know if the anxiety/insomnia gets worse. >25 min spent with face to face with patient, >50% counseling.

## 2011-03-12 NOTE — Assessment & Plan Note (Signed)
Likely related to coumadin.  Derm referral offered, declined.  Check labs, notify pt.  She'll notify me if sx are worse.

## 2011-03-18 ENCOUNTER — Ambulatory Visit: Payer: Medicare Other

## 2011-04-08 ENCOUNTER — Ambulatory Visit (INDEPENDENT_AMBULATORY_CARE_PROVIDER_SITE_OTHER): Payer: Medicare Other | Admitting: Family Medicine

## 2011-04-08 DIAGNOSIS — I2699 Other pulmonary embolism without acute cor pulmonale: Secondary | ICD-10-CM

## 2011-04-08 DIAGNOSIS — Z5181 Encounter for therapeutic drug level monitoring: Secondary | ICD-10-CM

## 2011-04-08 DIAGNOSIS — Z7901 Long term (current) use of anticoagulants: Secondary | ICD-10-CM

## 2011-04-08 NOTE — Patient Instructions (Signed)
Continue current dose, check in 4 weeks  

## 2011-04-10 ENCOUNTER — Ambulatory Visit: Payer: Medicare Other | Admitting: Family Medicine

## 2011-05-06 ENCOUNTER — Ambulatory Visit (INDEPENDENT_AMBULATORY_CARE_PROVIDER_SITE_OTHER): Payer: Medicare Other | Admitting: Family Medicine

## 2011-05-06 DIAGNOSIS — Z7901 Long term (current) use of anticoagulants: Secondary | ICD-10-CM

## 2011-05-06 DIAGNOSIS — I2699 Other pulmonary embolism without acute cor pulmonale: Secondary | ICD-10-CM

## 2011-05-06 DIAGNOSIS — Z5181 Encounter for therapeutic drug level monitoring: Secondary | ICD-10-CM

## 2011-05-06 NOTE — Patient Instructions (Signed)
Take 10 mg tomorrow then continue 10 mg daily, 7.5 mg Thurs, recheck 2 weeks

## 2011-05-19 ENCOUNTER — Ambulatory Visit: Payer: Medicare Other | Admitting: Family Medicine

## 2011-05-20 ENCOUNTER — Ambulatory Visit (INDEPENDENT_AMBULATORY_CARE_PROVIDER_SITE_OTHER): Payer: Medicare Other | Admitting: Family Medicine

## 2011-05-20 DIAGNOSIS — I2699 Other pulmonary embolism without acute cor pulmonale: Secondary | ICD-10-CM

## 2011-05-20 DIAGNOSIS — Z7901 Long term (current) use of anticoagulants: Secondary | ICD-10-CM | POA: Diagnosis not present

## 2011-05-20 DIAGNOSIS — Z5181 Encounter for therapeutic drug level monitoring: Secondary | ICD-10-CM

## 2011-05-20 NOTE — Patient Instructions (Signed)
10 mg daily, 7.5 mg Thurs, recheck 2 weeks (resume this dose, had her take an extra 2.5 mg prior to today's visit)  recheck in 4 weeks

## 2011-06-02 ENCOUNTER — Ambulatory Visit: Payer: Medicare Other | Admitting: Family Medicine

## 2011-06-03 ENCOUNTER — Encounter: Payer: Self-pay | Admitting: Family Medicine

## 2011-06-03 ENCOUNTER — Ambulatory Visit (INDEPENDENT_AMBULATORY_CARE_PROVIDER_SITE_OTHER): Payer: Medicare Other | Admitting: Family Medicine

## 2011-06-03 VITALS — BP 140/78 | HR 64 | Temp 98.2°F | Wt 189.0 lb

## 2011-06-03 DIAGNOSIS — M25519 Pain in unspecified shoulder: Secondary | ICD-10-CM

## 2011-06-03 DIAGNOSIS — I2699 Other pulmonary embolism without acute cor pulmonale: Secondary | ICD-10-CM | POA: Diagnosis not present

## 2011-06-03 DIAGNOSIS — N898 Other specified noninflammatory disorders of vagina: Secondary | ICD-10-CM | POA: Diagnosis not present

## 2011-06-03 DIAGNOSIS — M810 Age-related osteoporosis without current pathological fracture: Secondary | ICD-10-CM | POA: Diagnosis not present

## 2011-06-03 DIAGNOSIS — R413 Other amnesia: Secondary | ICD-10-CM

## 2011-06-03 NOTE — Progress Notes (Signed)
H/o memory loss, but no recent change per patient.  29/30 MMSE today, no change.  Compliant with meds.   H/o PE on coumadin.  Was on raloxifene prev.  Approaching 6 months duration of anticoagulation.  No other FH of clot.  No other personal hx of clot.  She wants to go another 3 months and then reconsider.  We talked about risk/benefit of continued anticoagulation vs stopping coumadin.  She understood and would like to consider.    Dxa due 2013.  She'd like to plan for summer 2013, after OV.  Prev lack of response to boniva and DVT/PE during raloxifene.    R shoulder pain.  She thinks her cuff is irritated.  Started about 6 months ago, intermittent discomfort, pain with certain movement.  Pain sleeping on R side.  Pain with some overhead movements.  No trauma.    She wanted gyn referral.  Had some discharge recently.  She'd like to see a female MD.    Meds, vitals, and allergies reviewed.   ROS: See HPI.  Otherwise, noncontributory.  nad ncat Mmm rrr ctab abd soft, not ttp R shoulder noted for pain with int rotation, AC testing and +impingement.  Pain with supraspinatus testing.  No arm drop.

## 2011-06-03 NOTE — Patient Instructions (Addendum)
Come back in 3 months and we'll talk about the bone density and the coumadin.  Don't change your meds for now.  Ask about seeing Dr. Patsy Lager about your shoulder and see Shirlee Limerick about your referral before you leave today. Take care. Glad to see you.

## 2011-06-04 ENCOUNTER — Encounter: Payer: Self-pay | Admitting: Family Medicine

## 2011-06-05 DIAGNOSIS — M25519 Pain in unspecified shoulder: Secondary | ICD-10-CM | POA: Insufficient documentation

## 2011-06-05 HISTORY — DX: Pain in unspecified shoulder: M25.519

## 2011-06-05 NOTE — Assessment & Plan Note (Signed)
Consider dxa later this year.   No response to boniva prev.  Should not go back on raloxifene.

## 2011-06-05 NOTE — Assessment & Plan Note (Signed)
Will follow, no change in meds.   

## 2011-06-05 NOTE — Assessment & Plan Note (Signed)
She'd like an other 3 months to consider.  We talked about risk and benefit of continued/stopping warfarin.  She'll consider.

## 2011-06-05 NOTE — Assessment & Plan Note (Signed)
I'll ask her to f/u with Dr. Patsy Lager about this.  She agrees.

## 2011-06-10 ENCOUNTER — Ambulatory Visit (INDEPENDENT_AMBULATORY_CARE_PROVIDER_SITE_OTHER): Payer: Medicare Other | Admitting: Family Medicine

## 2011-06-10 ENCOUNTER — Encounter: Payer: Self-pay | Admitting: Family Medicine

## 2011-06-10 ENCOUNTER — Ambulatory Visit (INDEPENDENT_AMBULATORY_CARE_PROVIDER_SITE_OTHER)
Admission: RE | Admit: 2011-06-10 | Discharge: 2011-06-10 | Disposition: A | Discharge status: Home / Self Care | Payer: Medicare Other | Source: Ambulatory Visit | Attending: Family Medicine | Admitting: Family Medicine

## 2011-06-10 VITALS — BP 140/88 | HR 96 | Temp 97.8°F | Ht 64.0 in | Wt 185.0 lb

## 2011-06-10 DIAGNOSIS — M7580 Other shoulder lesions, unspecified shoulder: Secondary | ICD-10-CM

## 2011-06-10 DIAGNOSIS — M25519 Pain in unspecified shoulder: Secondary | ICD-10-CM

## 2011-06-10 DIAGNOSIS — M19019 Primary osteoarthritis, unspecified shoulder: Secondary | ICD-10-CM

## 2011-06-10 DIAGNOSIS — M25511 Pain in right shoulder: Secondary | ICD-10-CM

## 2011-06-10 DIAGNOSIS — M719 Bursopathy, unspecified: Secondary | ICD-10-CM

## 2011-06-10 DIAGNOSIS — M67919 Unspecified disorder of synovium and tendon, unspecified shoulder: Secondary | ICD-10-CM | POA: Diagnosis not present

## 2011-06-10 DIAGNOSIS — M25819 Other specified joint disorders, unspecified shoulder: Secondary | ICD-10-CM | POA: Diagnosis not present

## 2011-06-10 NOTE — Progress Notes (Signed)
  Patient Name: Katrina Armstrong Date of Birth: May 31, 1928 Age: 78 y.o. Medical Record Number: [id number] Gender: female Date of Encounter: 06/10/2011  History of Present Illness:  Katrina Armstrong is a 78 y.o. very pleasant female patient who presents with the following:  Noticed it when was at [employer]. About a month or so ago. Also will read in bed. When bringing coffee cup around. Reaching up over the head will hurt.  No numbness or tingling.   Patient does have a pulmonary embolus and is on Coumadin. She is not taking any oral anti-inflammatories or any pain medicine. She does have pain at nighttime, and does have pain with motion as described. No prior dislocations, traumas, fractures, Robert intervention.  Past Medical History, Surgical History, Social History, Family History, Problem List, Medications, and Allergies have been reviewed and updated if relevant.  Review of Systems:  GEN: No fevers, chills. Nontoxic. Primarily MSK c/o today. MSK: Detailed in the HPI GI: tolerating PO intake without difficulty Neuro: No numbness, parasthesias, or tingling associated. Otherwise the pertinent positives of the ROS are noted above.    Physical Examination: Filed Vitals:   06/10/11 0855  BP: 140/88  Pulse: 96  Temp: 97.8 F (36.6 C)  TempSrc: Oral  Height: 5\' 4"  (1.626 m)  Weight: 185 lb (83.915 kg)  SpO2: 96%    Body mass index is 31.76 kg/(m^2).   GEN: WDWN, NAD, Non-toxic, Alert & Oriented x 3 HEENT: Atraumatic, Normocephalic.  Ears and Nose: No external deformity. EXTR: No clubbing/cyanosis/edema NEURO: Normal gait.  PSYCH: Normally interactive. Conversant. Not depressed or anxious appearing.  Calm demeanor.   Shoulder: R Inspection: No muscle wasting or winging CREPITUS WITH MOTION Ecchymosis/edema: neg  AC joint, scapula, clavicle: MILD TENDERNESS AT [facility] JOINT Cervical spine: NT, full ROM Spurling's: neg Abduction: full, 5/5 Flexion: full, 5/5 IR, full, lift-off:  5/5 ER at neutral: full, 5/5 AC crossover and compression: MILD R POS Neer: POS  Hawkins: POS Drop Test: neg Empty Can: neg Supraspinatus insertion: NT Bicipital groove: NT Speed's: neg Yergason's: neg Sulcus sign: neg Scapular dyskinesis: none C5-T1 intact Sensation intact Grip 5/5   Assessment and Plan: 1. Right shoulder pain  DG Shoulder Right  2. Osteoarthritis of acromioclavicular joint    3. Glenohumeral arthritis    4. Rotator cuff tendonitis      Xrays: Shoulder series + grashey Indication: shoulder pain Findings: No evidence of occult fracture mild glenohumeral arthritis AC joint: mild ac OA Impingement pathology: Type 2 acromium  Reviewed limitation of overhead activities. Recommended no longer doing [employer]. For now, limited things below the nose.  SubAC Injection and intraarticular injection Verbal consent was obtained from the patient. Risks (including infection), benefits, and alternatives were explained. Patient prepped with Chloraprep and Ethyl Chloride used for anesthesia. The subacromial space was injected using the posterior approach - half of the volume was injected in the subacromial space, then using a posterior approach, an intra-articular injection was performed. The patient tolerated the procedure well and had decreased pain post injection. No complications. Injection: 9 cc of Lidocaine 1% and 1cc of Depo-Medrol 40 mg. Needle: 22 gauge

## 2011-06-10 NOTE — Patient Instructions (Signed)
RECHECK IN 6 WEEKS

## 2011-06-16 ENCOUNTER — Ambulatory Visit (INDEPENDENT_AMBULATORY_CARE_PROVIDER_SITE_OTHER): Payer: Medicare Other | Admitting: Family Medicine

## 2011-06-16 ENCOUNTER — Encounter: Payer: Self-pay | Admitting: Family Medicine

## 2011-06-16 VITALS — BP 141/80 | HR 64 | Ht 64.0 in | Wt 184.0 lb

## 2011-06-16 DIAGNOSIS — N898 Other specified noninflammatory disorders of vagina: Secondary | ICD-10-CM | POA: Diagnosis not present

## 2011-06-16 DIAGNOSIS — I2699 Other pulmonary embolism without acute cor pulmonale: Secondary | ICD-10-CM

## 2011-06-16 DIAGNOSIS — Z7901 Long term (current) use of anticoagulants: Secondary | ICD-10-CM | POA: Diagnosis not present

## 2011-06-16 DIAGNOSIS — L820 Inflamed seborrheic keratosis: Secondary | ICD-10-CM

## 2011-06-16 DIAGNOSIS — Z5181 Encounter for therapeutic drug level monitoring: Secondary | ICD-10-CM

## 2011-06-16 NOTE — Patient Instructions (Signed)
Continue current dose, check in 4 weeks  

## 2011-06-16 NOTE — Progress Notes (Signed)
Addended by: Reva Bores on: 06/16/2011 02:31 PM   Modules accepted: Orders

## 2011-06-16 NOTE — Progress Notes (Signed)
History Here as new pt. With c/o vaginal discharge for several weeks, although notes it seems to be gone now.  No pap in many years.  She is 78 y.o.  Has had some vaginal irritation as well.  Is not currently sexually active.  Reports a growth on her vulva she would like looked at too.  She denies bleeding, pain.  Patient Active Problem List  Diagnoses  . HYPERLIPIDEMIA  . OSTEOPOROSIS  . Memory loss  . COLONIC POLYPS, HX OF  . DIVERTICULITIS, HX OF  . NSTEMI (non-ST elevated myocardial infarction)  . Pulmonary embolism  . Hair loss  . Shoulder pain   Past Surgical History  Procedure Date  . Appendectomy   . Hemorroidectomy    Family History  Problem Relation Age of Onset  . Hypertension      fam hx  . Stroke      sibling  . Other      brain tumor- sibling  . Other      PNA- sibling  . Hypertension Mother   . Melanoma Father    History   Social History  . Marital Status: Widowed    Spouse Name: N/A    Number of Children: 1  . Years of Education: 12   Occupational History  . Retired Designer, television/film set Other    Dispensing optician   Social History Main Topics  . Smoking status: Former Smoker    Quit date: 05/11/1978  . Smokeless tobacco: Never Used  . Alcohol Use: No  . Drug Use: No  . Sexually Active: Not on file   Other Topics Concern  . Not on file   Social History Narrative   All siblings dead, all sisters, last one died 2011[place], lives alone1 son, JohnRegular exercise: yes, CurvesEnjoys crafts, English as a second language teacher, Company secretary activity   Review of Systems  Constitutional: Negative for fever, chills and weight loss.  HENT: Negative for congestion.   Respiratory: Negative for cough and shortness of breath.   Gastrointestinal: Negative for nausea, vomiting, abdominal pain, diarrhea and constipation.  Genitourinary: Negative for dysuria and hematuria.  Musculoskeletal: Negative for back pain and joint pain.  Skin: Negative for rash.    Neurological: Negative for sensory change, focal weakness and headaches.  Psychiatric/Behavioral: Negative for depression.  Physical Exam  Vitals reviewed. Constitutional: She appears well-developed and well-nourished.  HENT:  Head: Normocephalic and atraumatic.  Neck: Normal range of motion.  Cardiovascular: Normal rate.   Pulmonary/Chest: Effort normal.  Abdominal: Soft.  Genitourinary: Vagina normal and uterus normal. There is lesion on the right labia. Cervix exhibits no motion tenderness, no discharge and no friability. Right adnexum displays no mass. Left adnexum displays no mass. No vaginal discharge found.       Warty consistent with seborrheic keratosis about .75 x 0.3 cm.  Procedure:  Surrounding area treated with KY, then TCA applied.    Assessment 1. Seborrheic keratosis, inflamed      Plan rtn in 2 wks for re-treatment. Wet prep sent, although no vaginal discharge noted now.

## 2011-06-16 NOTE — Patient Instructions (Signed)
Seborrheic Keratosis Seborrheic keratosis is a common, noncancerous (benign) skin growth that can occur anywhere on the skin.It looks like "stuck-on," waxy, rough, tan, brown, or black spots on the skin. These skin growths can be flat or raised.They are often called "barnacles" because of their pasted-on appearance.Usually, these skin growths appear in adulthood, around age 76, and increase in number as you age. They may also develop during pregnancy or following estrogen therapy. Many people may only have one growth appear in their lifetime, while some people may develop many growths. CAUSES It is unknown what causes these skin growths, but they appear to run in families. SYMPTOMS Seborrheic keratosis is often located on the face, chest, shoulders, back, or other areas. These growths are:  Usually painless, but may become irritated and itchy.   Yellow, brown, black, or other colors.   Slightly raised or have a flat surface.   Sometimes rough or wart-like in texture.   Often waxy on the surface.   Round or oval-shaped.   Sometimes "stuck-on" in appearance.   Sometimes single, but there are usually many growths.  Any growth that bleeds, itches on a regular basis, becomes inflamed, or becomes irritated needs to be evaluated by a skin specialist (dermatologist). DIAGNOSIS Diagnosis is mainly based on the way the growths appear. In some cases, it can be difficult to tell this type of skin growth from skin cancer. A skin growth tissue sample (biopsy) may be used to confirm the diagnosis. TREATMENT Most often, treatment is not needed because the skin growths are benign.If the skin growth is irritated easily by clothing or jewelry, causing it to scab or bleed, treatment may be recommended. Patients may also choose to have the growths removed because they do not like their appearance. Most commonly, these growths are treated with cryosurgery. In cryosurgery, liquid nitrogen is applied to  "freeze" the growth. The growth usually falls off within a matter of days. A blister may form and dry into a scab that will also fall off. After the growth or scab falls off, it may leave a dark or light spot on the skin. This color may fade over time, or it may remain permanent on the skin. HOME CARE INSTRUCTIONS If the skin growths are treated with cryosurgery, the treated area needs to be kept clean with water and soap. SEEK MEDICAL CARE IF:  You have questions about these growths or other skin problems.   You develop new symptoms, including:   A change in the appearance of the skin growth.   New growths.   Any bleeding, itching, or pain in the growths.   A skin growth that looks similar to seborrheic keratosis.  Document Released: 05/30/2010 Document Revised: 01/07/2011 Document Reviewed: 05/30/2010 [employer] Patient Information 2012 [employer]. 

## 2011-06-17 ENCOUNTER — Ambulatory Visit: Payer: Medicare Other

## 2011-06-17 LAB — WET PREP, GENITAL: Clue Cells Wet Prep HPF POC: NONE SEEN

## 2011-06-30 ENCOUNTER — Ambulatory Visit: Payer: Medicare Other

## 2011-06-30 ENCOUNTER — Ambulatory Visit: Payer: Medicare Other | Admitting: Obstetrics and Gynecology

## 2011-07-01 ENCOUNTER — Encounter: Payer: Self-pay | Admitting: Obstetrics & Gynecology

## 2011-07-01 ENCOUNTER — Ambulatory Visit (INDEPENDENT_AMBULATORY_CARE_PROVIDER_SITE_OTHER): Payer: Medicare Other | Admitting: Family Medicine

## 2011-07-01 ENCOUNTER — Ambulatory Visit (INDEPENDENT_AMBULATORY_CARE_PROVIDER_SITE_OTHER): Payer: Medicare Other | Admitting: Obstetrics & Gynecology

## 2011-07-01 DIAGNOSIS — Z7901 Long term (current) use of anticoagulants: Secondary | ICD-10-CM | POA: Diagnosis not present

## 2011-07-01 DIAGNOSIS — I2699 Other pulmonary embolism without acute cor pulmonale: Secondary | ICD-10-CM | POA: Diagnosis not present

## 2011-07-01 DIAGNOSIS — Z5181 Encounter for therapeutic drug level monitoring: Secondary | ICD-10-CM | POA: Diagnosis not present

## 2011-07-01 DIAGNOSIS — N9089 Other specified noninflammatory disorders of vulva and perineum: Secondary | ICD-10-CM

## 2011-07-01 DIAGNOSIS — L821 Other seborrheic keratosis: Secondary | ICD-10-CM

## 2011-07-01 HISTORY — DX: Other specified noninflammatory disorders of vulva and perineum: N90.89

## 2011-07-01 NOTE — Progress Notes (Signed)
History:  78 y.o. G1P0 here today for follow up seborrheic keratosis of the vulva diagnosed by Dr. Shawnie Pons on 06/16/11, and treated with TCA. She reports still feeling the lesion, but overall feeling better.  No erythema or inflammation.  The following portions of the patient's history were reviewed and updated as appropriate: allergies, current medications, past family history, past medical history, past social history, past surgical history and problem list.   Objective:  Physical Exam Blood pressure 143/80, pulse 58, height 5\' 4"  (1.626 m), weight 188 lb (85.276 kg). Gen: NAD Abd: Soft, nontender and nondistended Pelvic: Warty lesion consistent with seborrheic keratosis about 5-mm in diameter seen outside R labium majus, flesh colored, no abnormal characteristics.  Procedure: Surrounding area treated with KY, then TCA applied to lesion.  No complications  Assessment & Plan:  TCA treament of seborrheic keratosis Reevaluate in two weeks

## 2011-07-01 NOTE — Patient Instructions (Signed)
Return to clinic  for scheduled appointments or  for any gynecologic concerns.

## 2011-07-01 NOTE — Progress Notes (Signed)
Patient is here today for follow up of seb k on her labia.

## 2011-07-01 NOTE — Patient Instructions (Signed)
Decrease to 5 mg today only then10 mg daily 7.5 mg Tues, Thurs, Sat recheck 2 weeks

## 2011-07-09 ENCOUNTER — Encounter: Payer: Self-pay | Admitting: Family Medicine

## 2011-07-09 DIAGNOSIS — R920 Mammographic microcalcification found on diagnostic imaging of breast: Secondary | ICD-10-CM | POA: Diagnosis not present

## 2011-07-10 ENCOUNTER — Encounter: Payer: Self-pay | Admitting: *Deleted

## 2011-07-15 ENCOUNTER — Ambulatory Visit (INDEPENDENT_AMBULATORY_CARE_PROVIDER_SITE_OTHER): Payer: Medicare Other | Admitting: Obstetrics & Gynecology

## 2011-07-15 ENCOUNTER — Encounter: Payer: Self-pay | Admitting: Obstetrics & Gynecology

## 2011-07-15 VITALS — BP 147/86 | HR 69 | Ht 64.0 in | Wt 189.0 lb

## 2011-07-15 DIAGNOSIS — N9089 Other specified noninflammatory disorders of vulva and perineum: Secondary | ICD-10-CM

## 2011-07-15 DIAGNOSIS — L821 Other seborrheic keratosis: Secondary | ICD-10-CM | POA: Diagnosis not present

## 2011-07-15 NOTE — Patient Instructions (Signed)
Return to clinic for any gynecologic concerns or as scheduled.  

## 2011-07-15 NOTE — Progress Notes (Signed)
Patient is here for follow up of treatment of Katrina K.

## 2011-07-15 NOTE — Progress Notes (Signed)
History:  78 y.o. G1P0 here today for follow up seborrheic keratosis of the vulva diagnosed by Dr. Shawnie Pons on 06/16/11, and treated with TCA.  Also received 2nd TCA treatment by me on 07/01/11. She reports still feeling the lesion, but overall feeling better.  No erythema or inflammation.  The following portions of the patient's history were reviewed and updated as appropriate: allergies, current medications, past family history, past medical history, past social history, past surgical history and problem list.   Objective:  Physical Exam Blood pressure 147/86, pulse 69, height 5\' 4"  (1.626 m), weight 189 lb (85.73 kg). Gen: NAD Abd: Soft, nontender and nondistended Pelvic: Warty lesion consistent with seborrheic keratosis about 5-mm in diameter seen outside R labium majus, flesh colored, no abnormal characteristics. Looks flatter compared to previous exam.  Procedure: Surrounding area treated with KY, then TCA applied to lesion.  No complications  Assessment & Plan:  TCA treament of seborrheic keratosis Reevaluate in two weeks

## 2011-07-21 ENCOUNTER — Other Ambulatory Visit: Payer: Self-pay | Admitting: Dermatology

## 2011-07-21 DIAGNOSIS — L821 Other seborrheic keratosis: Secondary | ICD-10-CM | POA: Diagnosis not present

## 2011-07-21 DIAGNOSIS — L570 Actinic keratosis: Secondary | ICD-10-CM | POA: Diagnosis not present

## 2011-07-21 DIAGNOSIS — D239 Other benign neoplasm of skin, unspecified: Secondary | ICD-10-CM | POA: Diagnosis not present

## 2011-07-21 DIAGNOSIS — D485 Neoplasm of uncertain behavior of skin: Secondary | ICD-10-CM | POA: Diagnosis not present

## 2011-07-21 DIAGNOSIS — Z808 Family history of malignant neoplasm of other organs or systems: Secondary | ICD-10-CM | POA: Diagnosis not present

## 2011-07-21 DIAGNOSIS — L988 Other specified disorders of the skin and subcutaneous tissue: Secondary | ICD-10-CM | POA: Diagnosis not present

## 2011-07-21 DIAGNOSIS — L820 Inflamed seborrheic keratosis: Secondary | ICD-10-CM | POA: Diagnosis not present

## 2011-07-22 ENCOUNTER — Ambulatory Visit (INDEPENDENT_AMBULATORY_CARE_PROVIDER_SITE_OTHER): Payer: Medicare Other | Admitting: Family Medicine

## 2011-07-22 ENCOUNTER — Encounter: Payer: Self-pay | Admitting: Family Medicine

## 2011-07-22 ENCOUNTER — Ambulatory Visit: Payer: Self-pay | Admitting: Family Medicine

## 2011-07-22 VITALS — BP 130/80 | HR 70 | Temp 97.8°F | Wt 190.5 lb

## 2011-07-22 DIAGNOSIS — Z7901 Long term (current) use of anticoagulants: Secondary | ICD-10-CM

## 2011-07-22 DIAGNOSIS — Z5181 Encounter for therapeutic drug level monitoring: Secondary | ICD-10-CM

## 2011-07-22 DIAGNOSIS — M19019 Primary osteoarthritis, unspecified shoulder: Secondary | ICD-10-CM

## 2011-07-22 DIAGNOSIS — M19011 Primary osteoarthritis, right shoulder: Secondary | ICD-10-CM

## 2011-07-22 DIAGNOSIS — I2699 Other pulmonary embolism without acute cor pulmonale: Secondary | ICD-10-CM | POA: Diagnosis not present

## 2011-07-22 LAB — POCT INR: INR: 2.3

## 2011-07-22 NOTE — Progress Notes (Signed)
We had talked about her options at this point.  It is reasonable to stop coumadin as noted above.  We had discussed risk/benefit of stopping/continuing medicine.  I think it is reasonable to stop in 1 week.  Please notify pt.  Thanks.

## 2011-07-22 NOTE — Patient Instructions (Addendum)
continue10 mg daily 7.5 mg Tues, Thurs, Sat, await call from office for further instructions   Patient notified to stop her Coumadin in 1 week when her medication runs out, [place] 12:15pm, 07-22-11

## 2011-07-22 NOTE — Progress Notes (Signed)
  Patient Name: Katrina Armstrong Date of Birth: 12-03-1928 Age: 78 y.o. Medical Record Number: [id number] Gender: female Date of Encounter: 07/22/2011  History of Present Illness:  Katrina Armstrong is a 78 y.o. very pleasant female patient who presents with the following:  F/u R shoulder pain:  Had a skin iopsy, bled a lot. At derm  Shoulder pain is much improved. No pain, did make some of the lifting changes that I suggested in the gym. No numbness or tingling.  Past Medical History, Surgical History, Social History, Family History, Problem List, Medications, and Allergies have been reviewed and updated if relevant.  Review of Systems:  GEN: No fevers, chills. Nontoxic. Primarily MSK c/o today. MSK: Detailed in the HPI GI: tolerating PO intake without difficulty Neuro: No numbness, parasthesias, or tingling associated. Otherwise the pertinent positives of the ROS are noted above.    Physical Examination: Filed Vitals:   07/22/11 0904  BP: 130/80  Pulse: 70  Temp: 97.8 F (36.6 C)  TempSrc: Oral  Weight: 190 lb 8 oz (86.41 kg)    There is no height on file to calculate BMI.   GEN: WDWN, NAD, Non-toxic, Alert & Oriented x 3 HEENT: Atraumatic, Normocephalic.  Ears and Nose: No external deformity. EXTR: No clubbing/cyanosis/edema NEURO: Normal gait.  PSYCH: Normally interactive. Conversant. Not depressed or anxious appearing.  Calm demeanor.   Shoulder: R Inspection: No muscle wasting or winging Ecchymosis/edema: neg  AC joint, scapula, clavicle: NT Cervical spine: NT, full ROM Spurling's: neg Abduction: full, 5/5 Flexion: full, 5/5 IR, full, lift-off: 5/5 ER at neutral: full, 5/5 AC crossover and compression: neg Neer: neg Hawkins: neg Drop Test: neg Empty Can: neg Supraspinatus insertion: NT Bicipital groove: NT Speed's: neg Yergason's: neg Sulcus sign: neg Scapular dyskinesis: none C5-T1 intact Sensation intact Grip 5/5   Assessment and Plan: 1.  Arthritis of right shoulder region     Doing much better, f/u prn only Reviewed exercises to do / avoid in gym

## 2011-07-27 ENCOUNTER — Other Ambulatory Visit: Payer: Self-pay | Admitting: *Deleted

## 2011-07-27 NOTE — Telephone Encounter (Signed)
Received faxed refill request from pharmacy. Directions for Atorvastatin does not match request from pharmacy which shows that patient takes one tablet daily, chart shows one every other day. Please advise.

## 2011-07-28 MED ORDER — DONEPEZIL HCL 10 MG PO TABS: 10.0000 mg | ORAL_TABLET | Freq: Every day | ORAL | Status: DC

## 2011-07-28 MED ORDER — ATORVASTATIN CALCIUM 20 MG PO TABS: 20.0000 mg | ORAL_TABLET | Freq: Every day | ORAL | Status: DC

## 2011-07-28 MED ORDER — OMEGA-3-ACID ETHYL ESTERS 1 G PO CAPS: 2.0000 g | ORAL_CAPSULE | Freq: Two times a day (BID) | ORAL | Status: DC

## 2011-07-28 NOTE — Telephone Encounter (Signed)
Verify lipitor dose with patient then send all 3 rxs in.  Thanks.

## 2011-07-28 NOTE — Telephone Encounter (Signed)
Left message on machine for patient to call back.

## 2011-07-29 ENCOUNTER — Encounter: Payer: Self-pay | Admitting: Obstetrics & Gynecology

## 2011-07-29 ENCOUNTER — Ambulatory Visit (INDEPENDENT_AMBULATORY_CARE_PROVIDER_SITE_OTHER): Payer: Medicare Other | Admitting: Obstetrics & Gynecology

## 2011-07-29 ENCOUNTER — Other Ambulatory Visit: Payer: Self-pay | Admitting: *Deleted

## 2011-07-29 VITALS — BP 148/73 | HR 54 | Ht 64.0 in | Wt 187.0 lb

## 2011-07-29 DIAGNOSIS — N9089 Other specified noninflammatory disorders of vulva and perineum: Secondary | ICD-10-CM | POA: Diagnosis not present

## 2011-07-29 MED ORDER — DONEPEZIL HCL 10 MG PO TABS: 10.0000 mg | ORAL_TABLET | Freq: Every day | ORAL | Status: DC

## 2011-07-29 MED ORDER — ATORVASTATIN CALCIUM 20 MG PO TABS: 20.0000 mg | ORAL_TABLET | Freq: Every day | ORAL | Status: DC

## 2011-07-29 MED ORDER — OMEGA-3-ACID ETHYL ESTERS 1 G PO CAPS: 2.0000 g | ORAL_CAPSULE | Freq: Two times a day (BID) | ORAL | Status: DC

## 2011-07-29 MED ORDER — OMEPRAZOLE 20 MG PO CPDR: 20.0000 mg | DELAYED_RELEASE_CAPSULE | Freq: Every day | ORAL | Status: DC

## 2011-07-29 NOTE — Progress Notes (Signed)
History:  78 y.o. G1P0 here today for follow up seborrheic keratosis of the vulva diagnosed by Dr. Shawnie Pons on 06/16/11, and treated with TCA.  Also received 2nd and 3rd TCA treatments by me on 07/01/11 and 07/15/10. She reports  Having spotting from the lesion s/p last TCA treatment, but overall feeling better.  No erythema or inflammation reported.  The following portions of the patient's history were reviewed and updated as appropriate: allergies, current medications, past family history, past medical history, past social history, past surgical history and problem list.   Objective:  Physical Exam Blood pressure 148/73, pulse 54, height 5\' 4"  (1.626 m), weight 187 lb (84.823 kg). Gen: NAD Pelvic: Resolving warty, flat, lesion consistent with seborrheic keratosis about 5-mm in diameter seen outside R labium majus, flesh colored, no abnormal characteristics. Looks significantly flatter compared to previous exam. On the top, there is a small excoriated area.  No bleeding, no erythema  Assessment & Plan:  Patient is s/p TCA treament of seborrheic keratosis, no further treatments needed.  Healing well, small amount of spotting from excoriated area likely 2/2 friction.  Return to clinic for any gynecologic concerns or for annual exam.

## 2011-07-29 NOTE — Telephone Encounter (Signed)
Rx's were sent in on 07/28/2011 but sent to a local pharmacy, patient requested these Rx's be sent in via mail order.

## 2011-07-29 NOTE — Telephone Encounter (Signed)
Sent!

## 2011-08-21 ENCOUNTER — Other Ambulatory Visit: Payer: Self-pay | Admitting: Family Medicine

## 2011-08-21 DIAGNOSIS — E7800 Pure hypercholesterolemia, unspecified: Secondary | ICD-10-CM

## 2011-08-25 ENCOUNTER — Other Ambulatory Visit (INDEPENDENT_AMBULATORY_CARE_PROVIDER_SITE_OTHER): Payer: Medicare Other

## 2011-08-25 ENCOUNTER — Telehealth: Payer: Self-pay | Admitting: Family Medicine

## 2011-08-25 DIAGNOSIS — E7800 Pure hypercholesterolemia, unspecified: Secondary | ICD-10-CM | POA: Diagnosis not present

## 2011-08-25 DIAGNOSIS — M810 Age-related osteoporosis without current pathological fracture: Secondary | ICD-10-CM | POA: Diagnosis not present

## 2011-08-25 LAB — LIPID PANEL: Cholesterol: 171 mg/dL (ref 0–200)

## 2011-08-25 LAB — COMPREHENSIVE METABOLIC PANEL
Albumin: 3.9 g/dL (ref 3.5–5.2)
BUN: 14 mg/dL (ref 6–23)
CO2: 28 mEq/L (ref 19–32)
Calcium: 9 mg/dL (ref 8.4–10.5)
Chloride: 105 mEq/L (ref 96–112)
Glucose, Bld: 93 mg/dL (ref 70–99)
Potassium: 4 mEq/L (ref 3.5–5.1)

## 2011-08-25 MED ORDER — VALACYCLOVIR HCL 1 G PO TABS: 1000.0000 mg | ORAL_TABLET | Freq: Three times a day (TID) | ORAL | Status: DC

## 2011-08-25 NOTE — Telephone Encounter (Signed)
Pt came in for labs.  Has a recent on set dermatomal rash that itches on the L side of the neck.  Doesn't cross midline.  Will treat for shingles.

## 2011-08-26 LAB — VITAMIN D 25 HYDROXY (VIT D DEFICIENCY, FRACTURES): Vit D, 25-Hydroxy: 58 ng/mL (ref 30–89)

## 2011-09-01 ENCOUNTER — Encounter: Payer: Self-pay | Admitting: Family Medicine

## 2011-09-01 ENCOUNTER — Ambulatory Visit: Payer: Self-pay | Admitting: Family Medicine

## 2011-09-01 ENCOUNTER — Ambulatory Visit (INDEPENDENT_AMBULATORY_CARE_PROVIDER_SITE_OTHER): Payer: Medicare Other | Admitting: Family Medicine

## 2011-09-01 DIAGNOSIS — L659 Nonscarring hair loss, unspecified: Secondary | ICD-10-CM | POA: Diagnosis not present

## 2011-09-01 DIAGNOSIS — E785 Hyperlipidemia, unspecified: Secondary | ICD-10-CM | POA: Diagnosis not present

## 2011-09-01 DIAGNOSIS — M810 Age-related osteoporosis without current pathological fracture: Secondary | ICD-10-CM

## 2011-09-01 DIAGNOSIS — R413 Other amnesia: Secondary | ICD-10-CM

## 2011-09-01 DIAGNOSIS — I2699 Other pulmonary embolism without acute cor pulmonale: Secondary | ICD-10-CM

## 2011-09-01 NOTE — Progress Notes (Signed)
She had an itchy dermatomal rash on L side of neck and was treated with valtrex with resolution of sx.  The skin feels normal now except for 1 or 2 small areas.  Itching is much better.    H/o PE and off coumadin for about 3 weeks now.  This is reasonable.  We had talked about options and risks prev.  She is glad to come off the coumadin.  Her hair loss is better, unclear if this is related to coming off coumadin.   Osteoporosis.  Was on boniva and didn't have good effect and had a clot while on evista.  One remaining option would be prolia.  If she weren't going to start prolia, it wouldn't make sense to send her for DXA.  We talked about this.  She would like to go for the DXA.  Vit D is 58 on labs.    Her shoulder is better after seeing Dr. Patsy Lager and I appreciate his help (as does the patient).    Elevated Cholesterol: Using medications without problems:yes Muscle aches: no Diet compliance:yes Exercise:some She was concerned about taking lipitor.  She was worried about memory loss and wanted a trial off the medicine.  Prev NSTEMI was likley related to PE and not CAD.  She does have a h/o very mild memory loss but is still independent and active.  She has some occ word searching but no other alarming sx.   Labs d/w pt.    PMH and SH reviewed  ROS: See HPI, otherwise noncontributory.  Meds, vitals, and allergies reviewed.   GEN: nad, alert and oriented HEENT: mucous membranes moist NECK: supple w/o LA CV: rrr. PULM: ctab, no inc wob ABD: soft, +bs EXT: no edema SKIN: no acute rash but faint resolving lesions noted on L side of neck on dermatomal distribution.

## 2011-09-01 NOTE — Assessment & Plan Note (Signed)
Improved, unclear source.  Will follow clinically.  Potentially related to coumadin use.

## 2011-09-01 NOTE — Assessment & Plan Note (Signed)
Will follow clinically.  No obvious decline.  Hold lipitor for now.

## 2011-09-01 NOTE — Assessment & Plan Note (Signed)
She wants a trial off lipitor and this is reasonable. She'll notify us with update.

## 2011-09-01 NOTE — Assessment & Plan Note (Signed)
Consider prolia if DXA is low.  She agrees.  Vit D okay.  Continue as is.

## 2011-09-01 NOTE — Assessment & Plan Note (Signed)
Single event, s/o 6 months tx, doing well.  Stopped coumadin.  This is reasonable.

## 2011-09-01 NOTE — Patient Instructions (Addendum)
See Shirlee Limerick about your referral before you leave today.  DXA at [facility] in [place] if possible.   Stop the lipitor for now and keep taking the vitamin D/calcium.   Let know how you think your memory is in about 6 weeks.  Call me or Lugene with an update on your memory.

## 2011-11-02 ENCOUNTER — Encounter: Payer: Self-pay | Admitting: Cardiology

## 2011-11-09 DIAGNOSIS — H1045 Other chronic allergic conjunctivitis: Secondary | ICD-10-CM | POA: Diagnosis not present

## 2011-11-09 DIAGNOSIS — H04129 Dry eye syndrome of unspecified lacrimal gland: Secondary | ICD-10-CM | POA: Diagnosis not present

## 2011-12-14 ENCOUNTER — Emergency Department (HOSPITAL_COMMUNITY)
Admission: EM | Admit: 2011-12-14 | Discharge: 2011-12-14 | Disposition: A | Discharge status: Home / Self Care | Payer: Medicare Other | Attending: Emergency Medicine | Admitting: Emergency Medicine

## 2011-12-14 ENCOUNTER — Encounter (HOSPITAL_COMMUNITY): Payer: Self-pay | Admitting: Cardiology

## 2011-12-14 DIAGNOSIS — Z860100 Personal history of colon polyps, unspecified: Secondary | ICD-10-CM | POA: Insufficient documentation

## 2011-12-14 DIAGNOSIS — R209 Unspecified disturbances of skin sensation: Secondary | ICD-10-CM | POA: Insufficient documentation

## 2011-12-14 DIAGNOSIS — Z0389 Encounter for observation for other suspected diseases and conditions ruled out: Secondary | ICD-10-CM | POA: Diagnosis not present

## 2011-12-14 DIAGNOSIS — M810 Age-related osteoporosis without current pathological fracture: Secondary | ICD-10-CM | POA: Insufficient documentation

## 2011-12-14 DIAGNOSIS — Z86711 Personal history of pulmonary embolism: Secondary | ICD-10-CM | POA: Diagnosis not present

## 2011-12-14 DIAGNOSIS — E785 Hyperlipidemia, unspecified: Secondary | ICD-10-CM | POA: Diagnosis not present

## 2011-12-14 DIAGNOSIS — R6889 Other general symptoms and signs: Secondary | ICD-10-CM | POA: Diagnosis not present

## 2011-12-14 DIAGNOSIS — R200 Anesthesia of skin: Secondary | ICD-10-CM

## 2011-12-14 LAB — POCT I-STAT TROPONIN I: Troponin i, poc: 0 ng/mL (ref 0.00–0.08)

## 2011-12-14 LAB — POCT I-STAT, CHEM 8
Hemoglobin: 14.3 g/dL (ref 12.0–15.0)
Sodium: 142 mEq/L (ref 135–145)
TCO2: 24 mmol/L (ref 0–100)

## 2011-12-14 NOTE — ED Provider Notes (Signed)
History     CSN: [id number]  Arrival date & time 12/14/11  8295   First MD Initiated Contact with Patient 12/14/11 [phone]      Chief Complaint  Patient presents with  . Hypertension    (Consider location/radiation/quality/duration/timing/severity/associated sxs/prior treatment) HPI Pt to department via EMS from home- pt reports she has a hx blood clots and reports starting to have numbness in her left leg last night. Also reports left arm numbness this morning also. Bp-212/104 Now-152/70. Denies any pain on arrival.  Past Medical History  Diagnosis Date  . Hyperlipidemia   . Memory loss   . Personal history of other diseases of digestive system   . Personal history of colonic polyps   . History of cardiovascular stress test 06/05/09    [facility]  Echo- mild AS/mod LVH. Myocardial perfusion imaging is normal. Overall left ventricular function is normal without regional wall motion abnormalities.  . Alzheimer's dementia   . Pulmonary embolism     12/2010  . Osteoporosis, unspecified     lack of response to [facility], started Evista 12/11, stopped 8/12 with PE, due for repeat DXA 2013  . Normal echocardiogram     12/2010, EF 55-60%    Past Surgical History  Procedure Date  . Appendectomy   . Hemorroidectomy     Family History  Problem Relation Age of Onset  . Hypertension      fam hx  . Stroke      sibling  . Other      brain tumor- sibling  . Other      PNA- sibling  . Hypertension Mother   . Melanoma Father     History  Substance Use Topics  . Smoking status: Former Smoker    Quit date: 05/11/1978  . Smokeless tobacco: Never Used  . Alcohol Use: No    OB History    Grav Para Term Preterm Abortions TAB SAB Ect Mult Living   1         1      Review of Systems  All other systems reviewed and are negative.    Allergies  Boniva and Raloxifene  Home Medications   Current Outpatient Rx  Name Route Sig Dispense Refill  . CALCIUM CARB-CHOLECALCIFEROL  600-400 MG-UNIT PO TABS Oral Take 1 tablet by mouth daily.     Marland Kitchen VITAMIN D 2000 UNITS PO CAPS Oral Take 1 capsule by mouth daily.    Marland Kitchen DOCUSATE SODIUM 100 MG PO CAPS Oral Take 100 mg by mouth daily.      . DONEPEZIL HCL 10 MG PO TABS Oral Take 10 mg by mouth at bedtime.    Marland Kitchen GARLIC OIL 1000 MG PO CAPS Oral Take 1 capsule by mouth daily.    Marland Kitchen GLUCOSAMINE-CHONDROITIN PO CAPS Oral Take 1 capsule by mouth 2 (two) times daily. 2000 mg once daily    . ADULT MULTIVITAMIN W/MINERALS CH Oral Take 1 tablet by mouth daily.    . OMEGA-3-ACID ETHYL ESTERS 1 G PO CAPS Oral Take 2 capsules (2 g total) by mouth 2 (two) times daily. 360 capsule 3  . OMEPRAZOLE 20 MG PO CPDR Oral Take 20 mg by mouth daily as needed. For acid reflux    . POTASSIUM CITRATE PO Oral Take 1 tablet by mouth daily.    Marland Kitchen VITAMIN B-12 100 MCG PO TABS Oral Take 100 mcg by mouth daily.     Marland Kitchen VITAMIN C 500 MG PO TABS Oral Take 1,000  mg by mouth daily.        BP 150/65  Pulse 68  Temp 98 F (36.7 C) (Oral)  Resp 18  SpO2 98%  Physical Exam  Nursing note and vitals reviewed. Constitutional: She is oriented to person, place, and time. She appears well-developed. No distress.  HENT:  Head: Normocephalic and atraumatic.  Eyes: Pupils are equal, round, and reactive to light.  Neck: Normal range of motion.  Cardiovascular: Normal rate and intact distal pulses.         Date: 12/14/2011  Rate: 58  Rhythm: normal sinus rhythm  QRS Axis: normal  Intervals: normal  ST/T Wave abnormalities: normal  Conduction Disutrbances: none  Narrative Interpretation: unremarkable      Pulmonary/Chest: No respiratory distress.  Abdominal: Normal appearance. She exhibits no distension.  Musculoskeletal: Normal range of motion.  Neurological: She is alert and oriented to person, place, and time. She has normal strength. No cranial nerve deficit or sensory deficit. Coordination and gait normal. GCS eye subscore is 4. GCS verbal subscore is 5. GCS  motor subscore is 6.  Skin: Skin is warm and dry. No rash noted.  Psychiatric: She has a normal mood and affect. Her behavior is normal.    ED Course  Procedures (including critical care time)  Labs Reviewed  POCT I-STAT, CHEM 8 - Abnormal; Notable for the following:    Glucose, Bld 104 (*)     All other components within normal limits  POCT I-STAT TROPONIN I   No results found.   1. Numbness       MDM  After treatment in the ED the patient feels back to baseline and wants to go home.  Patient did not want to do an MRI.  She states that her symptoms are gone.  I instructed her to continue daily aspirin and follow up with her doctor when she gets back from her trip.  She is back to baseline.        Katrina Shi, MD 12/14/11 [phone]

## 2011-12-14 NOTE — ED Notes (Signed)
Pt to department via EMS from home- pt reports she has a hx blood clots and reports starting to have pain in her left leg last night. Also reports left arm pain this morning also. Bp-212/104 Now-152/70. Denies any pain on arrival. Hr-70. 20g left hand. CBG-106

## 2012-01-07 DIAGNOSIS — Z1231 Encounter for screening mammogram for malignant neoplasm of breast: Secondary | ICD-10-CM | POA: Diagnosis not present

## 2012-01-07 DIAGNOSIS — M810 Age-related osteoporosis without current pathological fracture: Secondary | ICD-10-CM | POA: Diagnosis not present

## 2012-01-08 ENCOUNTER — Encounter: Payer: Self-pay | Admitting: *Deleted

## 2012-01-08 ENCOUNTER — Encounter: Payer: Self-pay | Admitting: Family Medicine

## 2012-01-13 ENCOUNTER — Encounter: Payer: Self-pay | Admitting: Family Medicine

## 2012-02-25 DIAGNOSIS — Z23 Encounter for immunization: Secondary | ICD-10-CM | POA: Diagnosis not present

## 2012-03-23 ENCOUNTER — Other Ambulatory Visit: Payer: Self-pay | Admitting: Family Medicine

## 2012-03-23 ENCOUNTER — Other Ambulatory Visit (INDEPENDENT_AMBULATORY_CARE_PROVIDER_SITE_OTHER): Payer: Medicare Other

## 2012-03-23 DIAGNOSIS — R7309 Other abnormal glucose: Secondary | ICD-10-CM

## 2012-03-23 DIAGNOSIS — E785 Hyperlipidemia, unspecified: Secondary | ICD-10-CM

## 2012-03-23 DIAGNOSIS — R413 Other amnesia: Secondary | ICD-10-CM

## 2012-03-23 DIAGNOSIS — M810 Age-related osteoporosis without current pathological fracture: Secondary | ICD-10-CM

## 2012-03-23 DIAGNOSIS — R7303 Prediabetes: Secondary | ICD-10-CM

## 2012-03-23 LAB — BASIC METABOLIC PANEL
BUN: 17 mg/dL (ref 6–23)
Calcium: 9.3 mg/dL (ref 8.4–10.5)
GFR: 60.7 mL/min (ref >60.00)
Potassium: 4.4 mEq/L (ref 3.5–5.1)

## 2012-03-23 LAB — HEMOGLOBIN A1C: Hgb A1c MFr Bld: 6.1 % (ref 4.6–6.5)

## 2012-03-25 ENCOUNTER — Encounter: Payer: Self-pay | Admitting: Family Medicine

## 2012-03-25 ENCOUNTER — Ambulatory Visit (INDEPENDENT_AMBULATORY_CARE_PROVIDER_SITE_OTHER): Payer: Medicare Other | Admitting: Family Medicine

## 2012-03-25 VITALS — BP 150/90 | HR 68 | Temp 97.9°F | Ht 63.25 in | Wt 190.0 lb

## 2012-03-25 DIAGNOSIS — E785 Hyperlipidemia, unspecified: Secondary | ICD-10-CM | POA: Diagnosis not present

## 2012-03-25 DIAGNOSIS — M810 Age-related osteoporosis without current pathological fracture: Secondary | ICD-10-CM

## 2012-03-25 DIAGNOSIS — R413 Other amnesia: Secondary | ICD-10-CM

## 2012-03-25 LAB — LDL CHOLESTEROL, DIRECT: Direct LDL: 174.9 mg/dL

## 2012-03-25 LAB — LIPID PANEL: Triglycerides: 120 mg/dL (ref 0.0–149.0)

## 2012-03-25 NOTE — Patient Instructions (Addendum)
we'll be in touch about your cholesterol labs (probably Tuesday or Wednesday).   Take care.

## 2012-03-25 NOTE — Progress Notes (Signed)
Recheck BP 150/90.   Routine f/u.    Lipids are pending. Now off statin.  I told pt I'd call her to discuss when labs are resulted.  She would like to stay off statins if possible.    Dementia. Compliant with meds.  Functional at home, no alarming episodes (ie getting lost, hazardous conditions).  She remember/asked about my family and my daughter's birthday.  Playing bridge some.    H/o osteoporosis with DXA done 01/07/12.  Results reviewed with patient. Off meds at this point, h/o PE on raloxifene prev.   PMH and SH reviewed  ROS: See HPI, otherwise noncontributory.  Meds, vitals, and allergies reviewed.    GEN: nad, alert and oriented HEENT: mucous membranes moist NECK: supple w/o LA CV: rrr. PULM: ctab, no inc wob ABD: soft, +bs EXT: no edema SKIN: no acute rash

## 2012-03-26 ENCOUNTER — Encounter: Payer: Self-pay | Admitting: Family Medicine

## 2012-03-26 NOTE — Assessment & Plan Note (Signed)
D/w pt, would not restart tx with rx meds at this point. She agrees.

## 2012-03-26 NOTE — Assessment & Plan Note (Signed)
Stable by reported sx and no obvious deficit on the exam today. Continue as is. She agrees. >25 min spent with face to face with patient, >50% counseling and/or coordinating care

## 2012-03-26 NOTE — Assessment & Plan Note (Signed)
See notes on labs. 

## 2012-03-28 ENCOUNTER — Encounter: Payer: Self-pay | Admitting: Family Medicine

## 2012-03-28 DIAGNOSIS — R739 Hyperglycemia, unspecified: Secondary | ICD-10-CM

## 2012-03-28 HISTORY — DX: Hyperglycemia, unspecified: R73.9

## 2012-08-15 ENCOUNTER — Other Ambulatory Visit: Payer: Self-pay | Admitting: Family Medicine

## 2012-08-16 DIAGNOSIS — L570 Actinic keratosis: Secondary | ICD-10-CM | POA: Diagnosis not present

## 2012-08-16 DIAGNOSIS — L723 Sebaceous cyst: Secondary | ICD-10-CM | POA: Diagnosis not present

## 2012-08-16 DIAGNOSIS — Z808 Family history of malignant neoplasm of other organs or systems: Secondary | ICD-10-CM | POA: Diagnosis not present

## 2012-08-16 DIAGNOSIS — L821 Other seborrheic keratosis: Secondary | ICD-10-CM | POA: Diagnosis not present

## 2012-08-17 ENCOUNTER — Other Ambulatory Visit: Payer: Medicare Other

## 2012-08-18 ENCOUNTER — Ambulatory Visit (INDEPENDENT_AMBULATORY_CARE_PROVIDER_SITE_OTHER): Payer: Medicare Other | Admitting: Family Medicine

## 2012-08-18 ENCOUNTER — Encounter: Payer: Self-pay | Admitting: Family Medicine

## 2012-08-18 VITALS — BP 144/80 | HR 86 | Temp 97.7°F | Wt 191.5 lb

## 2012-08-18 DIAGNOSIS — E785 Hyperlipidemia, unspecified: Secondary | ICD-10-CM

## 2012-08-18 DIAGNOSIS — R5381 Other malaise: Secondary | ICD-10-CM | POA: Diagnosis not present

## 2012-08-18 DIAGNOSIS — R7309 Other abnormal glucose: Secondary | ICD-10-CM

## 2012-08-18 DIAGNOSIS — R413 Other amnesia: Secondary | ICD-10-CM | POA: Diagnosis not present

## 2012-08-18 DIAGNOSIS — R739 Hyperglycemia, unspecified: Secondary | ICD-10-CM

## 2012-08-18 DIAGNOSIS — R5383 Other fatigue: Secondary | ICD-10-CM

## 2012-08-18 DIAGNOSIS — K219 Gastro-esophageal reflux disease without esophagitis: Secondary | ICD-10-CM

## 2012-08-18 LAB — CBC WITH DIFFERENTIAL/PLATELET
Basophils Relative: 0.8 % (ref 0.0–3.0)
Eosinophils Absolute: 0.1 10*3/uL (ref 0.0–0.7)
Eosinophils Relative: 1.1 % (ref 0.0–5.0)
HCT: 38 % (ref 36.0–46.0)
Hemoglobin: 12.7 g/dL (ref 12.0–15.0)
MCHC: 33.4 g/dL (ref 30.0–36.0)
MCV: 86.9 fl (ref 78.0–100.0)
Monocytes Absolute: 0.5 10*3/uL (ref 0.1–1.0)
Neutro Abs: 4.5 10*3/uL (ref 1.4–7.7)
Neutrophils Relative %: 48.1 % (ref 43.0–77.0)
RBC: 4.37 Mil/uL (ref 3.87–5.11)
WBC: 9.4 10*3/uL (ref 4.5–10.5)

## 2012-08-18 LAB — BASIC METABOLIC PANEL
CO2: 26 mEq/L (ref 19–32)
Chloride: 103 mEq/L (ref 96–112)
Potassium: 4 mEq/L (ref 3.5–5.1)
Sodium: 139 mEq/L (ref 135–145)

## 2012-08-18 LAB — LIPID PANEL
Cholesterol: 215 mg/dL — ABNORMAL HIGH (ref 0–200)
HDL: 41.1 mg/dL (ref >39.00)
Total CHOL/HDL Ratio: 5
Triglycerides: 94 mg/dL (ref 0.0–149.0)
VLDL: 18.8 mg/dL (ref 0.0–40.0)

## 2012-08-18 MED ORDER — OMEPRAZOLE 20 MG PO CPDR: 20.0000 mg | DELAYED_RELEASE_CAPSULE | Freq: Every day | ORAL | Status: DC | PRN

## 2012-08-18 MED ORDER — DONEPEZIL HCL 10 MG PO TABS: 10.0000 mg | ORAL_TABLET | Freq: Every day | ORAL | Status: DC

## 2012-08-18 NOTE — Progress Notes (Signed)
She continues to have trouble settling her husband's estate and this is troubling to her. She is fatigued and didn't know if this was related. She is sleeping well.  She is less motivated to exercise.   Memory loss. A&O x3 and 3/3 on recall.  5/5 on DLROW.  "I can't tell that I'm any worse."  No red flag events.  Still living alone, independently.    Elevated Cholesterol: No meds.  Muscle aches: no Diet compliance:yes Exercise: less recently.  See above.   Meds, vitals, and allergies reviewed.   ROS: See HPI.  Otherwise, noncontributory.  GEN: nad, alert and oriented HEENT: mucous membranes moist NECK: supple w/o LA CV: rrr.  no murmur PULM: ctab, no inc wob ABD: soft, +bs EXT: no edema SKIN: no acute rash A&O x3 and 3/3 on recall.  5/5 on DLROW.

## 2012-08-18 NOTE — Patient Instructions (Signed)
Go to the lab on the way out.  We'll contact you with your lab report. Take care.  I sent your meds to [facility].  Recheck in 6 months.  I would get a flu shot each fall.   If the fatigue doesn't improve, then notify me.

## 2012-08-19 DIAGNOSIS — R5383 Other fatigue: Secondary | ICD-10-CM

## 2012-08-19 DIAGNOSIS — K219 Gastro-esophageal reflux disease without esophagitis: Secondary | ICD-10-CM | POA: Insufficient documentation

## 2012-08-19 HISTORY — DX: Other fatigue: R53.83

## 2012-08-19 NOTE — Assessment & Plan Note (Signed)
See notes on labs. She was not enthused about statin use prev and her numbers are improved.  Continue as is, off meds.

## 2012-08-19 NOTE — Assessment & Plan Note (Signed)
No clear source other than chronic social upheaval, see notes on labs.

## 2012-08-19 NOTE — Assessment & Plan Note (Signed)
Also d/w pt.  No sx with daily use of medicine. Doing well.

## 2012-08-19 NOTE — Assessment & Plan Note (Signed)
Memory loss. A&O x3 and 3/3 on recall.  5/5 on DLROW.  "I can't tell that I'm any worse."  No red flag events.  Still living alone, independently.  Will continue as is with meds.

## 2012-08-22 ENCOUNTER — Encounter: Payer: Medicare Other | Admitting: Family Medicine

## 2012-08-22 ENCOUNTER — Encounter: Payer: Self-pay | Admitting: *Deleted

## 2012-12-08 DIAGNOSIS — H1045 Other chronic allergic conjunctivitis: Secondary | ICD-10-CM | POA: Diagnosis not present

## 2013-01-23 ENCOUNTER — Encounter: Payer: Self-pay | Admitting: Family Medicine

## 2013-01-23 DIAGNOSIS — Z1231 Encounter for screening mammogram for malignant neoplasm of breast: Secondary | ICD-10-CM | POA: Diagnosis not present

## 2013-01-26 ENCOUNTER — Encounter: Payer: Self-pay | Admitting: Family Medicine

## 2013-01-26 DIAGNOSIS — N63 Unspecified lump in unspecified breast: Secondary | ICD-10-CM | POA: Diagnosis not present

## 2013-01-30 ENCOUNTER — Encounter: Payer: Self-pay | Admitting: Family Medicine

## 2013-01-30 DIAGNOSIS — N6009 Solitary cyst of unspecified breast: Secondary | ICD-10-CM | POA: Diagnosis not present

## 2013-02-03 DIAGNOSIS — Z23 Encounter for immunization: Secondary | ICD-10-CM | POA: Diagnosis not present

## 2013-03-30 ENCOUNTER — Ambulatory Visit (INDEPENDENT_AMBULATORY_CARE_PROVIDER_SITE_OTHER): Payer: Medicare Other | Admitting: Family Medicine

## 2013-03-30 ENCOUNTER — Encounter: Payer: Self-pay | Admitting: Family Medicine

## 2013-03-30 VITALS — BP 130/80 | HR 65 | Temp 97.7°F | Wt 187.5 lb

## 2013-03-30 DIAGNOSIS — R413 Other amnesia: Secondary | ICD-10-CM

## 2013-03-30 DIAGNOSIS — R739 Hyperglycemia, unspecified: Secondary | ICD-10-CM

## 2013-03-30 DIAGNOSIS — R7309 Other abnormal glucose: Secondary | ICD-10-CM | POA: Diagnosis not present

## 2013-03-30 LAB — HEMOGLOBIN A1C: Hgb A1c MFr Bld: 6.1 % (ref 4.6–6.5)

## 2013-03-30 MED ORDER — DONEPEZIL HCL 10 MG PO TABS: 10.0000 mg | ORAL_TABLET | Freq: Every day | ORAL | Status: DC

## 2013-03-30 MED ORDER — OMEPRAZOLE 20 MG PO CPDR: 20.0000 mg | DELAYED_RELEASE_CAPSULE | Freq: Every day | ORAL | Status: DC | PRN

## 2013-03-30 NOTE — Patient Instructions (Signed)
Go to the lab on the way out.  We'll contact you with your lab report.  Schedule a physical in about 6 months.   Take care.  Glad to see you.

## 2013-03-30 NOTE — Assessment & Plan Note (Signed)
Recheck A1c today.  See notes on labs.  D/w pt.

## 2013-03-30 NOTE — Assessment & Plan Note (Signed)
A&O x3 and 3/3 on recall. No red flag events, but occ trouble with names. Still living alone, independently.  She hasn't noted any sig changes.   Continue as is.  She appears to be stable.

## 2013-03-30 NOTE — Progress Notes (Signed)
Pre-visit discussion using our clinic review tool. No additional management support is needed unless otherwise documented below in the visit note.  H/o memory loss. She is still trying to settle her husband's estate and this hasn't been easy for her.  It's been over 3 years now about the estate.  A&O x3 and 3/3 on recall. No red flag events, but occ trouble with names. Still living alone, independently.  She hasn't noted any sig changes.    Hyperglycemia.  Due for f/u labs.  No meds.  Prev labs reviewed.   Had a flu shot in 01/2013 per her report.  She is exercising 5 days a week at [employer].    She has plans for Thanksgiving with extended family.    Meds, vitals, and allergies reviewed.   ROS: See HPI.  Otherwise, noncontributory.  GEN: nad, alert and oriented HEENT: mucous membranes moist NECK: supple w/o LA CV: rrr. PULM: ctab, no inc wob ABD: soft, +bs EXT: no edema SKIN: no acute rash

## 2013-05-02 ENCOUNTER — Telehealth: Payer: Self-pay | Admitting: Obstetrics and Gynecology

## 2013-05-02 MED ORDER — FLUCONAZOLE 150 MG PO TABS: 150.0000 mg | ORAL_TABLET | Freq: Once | ORAL | Status: DC

## 2013-05-02 NOTE — Telephone Encounter (Signed)
Patient called complaining of vaginal discharge that has not improved with over the counter remedies.  Discussed with Dr. Jolayne Panther.  She wants [place] to call in Diflucan and have patient follow-up if this does not improve.  Patient uses [facility] in [place].

## 2013-05-05 ENCOUNTER — Ambulatory Visit: Payer: Medicare Other | Admitting: Family Medicine

## 2013-05-05 ENCOUNTER — Ambulatory Visit (INDEPENDENT_AMBULATORY_CARE_PROVIDER_SITE_OTHER): Payer: Medicare Other | Admitting: Family Medicine

## 2013-05-05 ENCOUNTER — Encounter: Payer: Self-pay | Admitting: Family Medicine

## 2013-05-05 VITALS — BP 136/86 | HR 68 | Temp 97.5°F | Wt 187.0 lb

## 2013-05-05 DIAGNOSIS — H6980 Other specified disorders of Eustachian tube, unspecified ear: Secondary | ICD-10-CM

## 2013-05-05 DIAGNOSIS — H8110 Benign paroxysmal vertigo, unspecified ear: Secondary | ICD-10-CM

## 2013-05-05 DIAGNOSIS — H6990 Unspecified Eustachian tube disorder, unspecified ear: Secondary | ICD-10-CM

## 2013-05-05 HISTORY — DX: Unspecified eustachian tube disorder, unspecified ear: H69.90

## 2013-05-05 HISTORY — DX: Other specified disorders of Eustachian tube, unspecified ear: H69.80

## 2013-05-05 HISTORY — DX: Benign paroxysmal vertigo, unspecified ear: H81.10

## 2013-05-05 NOTE — Progress Notes (Signed)
Pre-visit discussion using our clinic review tool. No additional management support is needed unless otherwise documented below in the visit note.  For the last few days she had noted some dysequilibrium.  Worse when she sent to lay down.  Sensation of vertigo. L ear pain. No hearing changes, not from baseline hearing loss (she has troubles with hearing at movies, but this isn't acute).  No fevers.  No vertigo now.  No sx like this prev, not in the last few years.  No focal weakness or speech changes.  Swallowing well.    When she when to lay down today "it was like my head was spinning."    Meds, vitals, and allergies reviewed.   ROS: See HPI.  Otherwise, noncontributory.  GEN: nad, alert and oriented HEENT: mucous membranes moist, tm wnl but cannot move L TM with valsalva, nasal and OP exam wnl NECK: supple w/o LA, no bruit CV: rrr.  PULM: ctab, no inc wob ABD: soft, +bs EXT: no edema CN 2-12 wnl B, S/S/DTR wnl x4 Gait wnl

## 2013-05-05 NOTE — Patient Instructions (Signed)
Take OTC meclizine 12.5mg  once or twice a day for the vertigo and this should gradually improve.  Take care.  Glad to see you .

## 2013-05-05 NOTE — Assessment & Plan Note (Signed)
Likely cause of L ear sx.  Should resolve on its own.  F/u prn.  Anatomy d/w pt.  I would not intervene o/w as she is going to start meclizine.  She agrees.

## 2013-05-05 NOTE — Assessment & Plan Note (Signed)
Likely dx, d/w pt.  Worse with laying down.  Would use meclizine 12.5mg  BID prn and fu prn.  Should resolve.  Not likely for other CNS cause of her sx and no orthostatic sx.

## 2013-07-05 IMAGING — CR DG CHEST 2V
2 series · 2 of 2 positions shown · non-contrast
Comparison: None.

CLINICAL DATA: Shortness of breath, numbness

CHEST - 2 VIEW

[w chest pa]
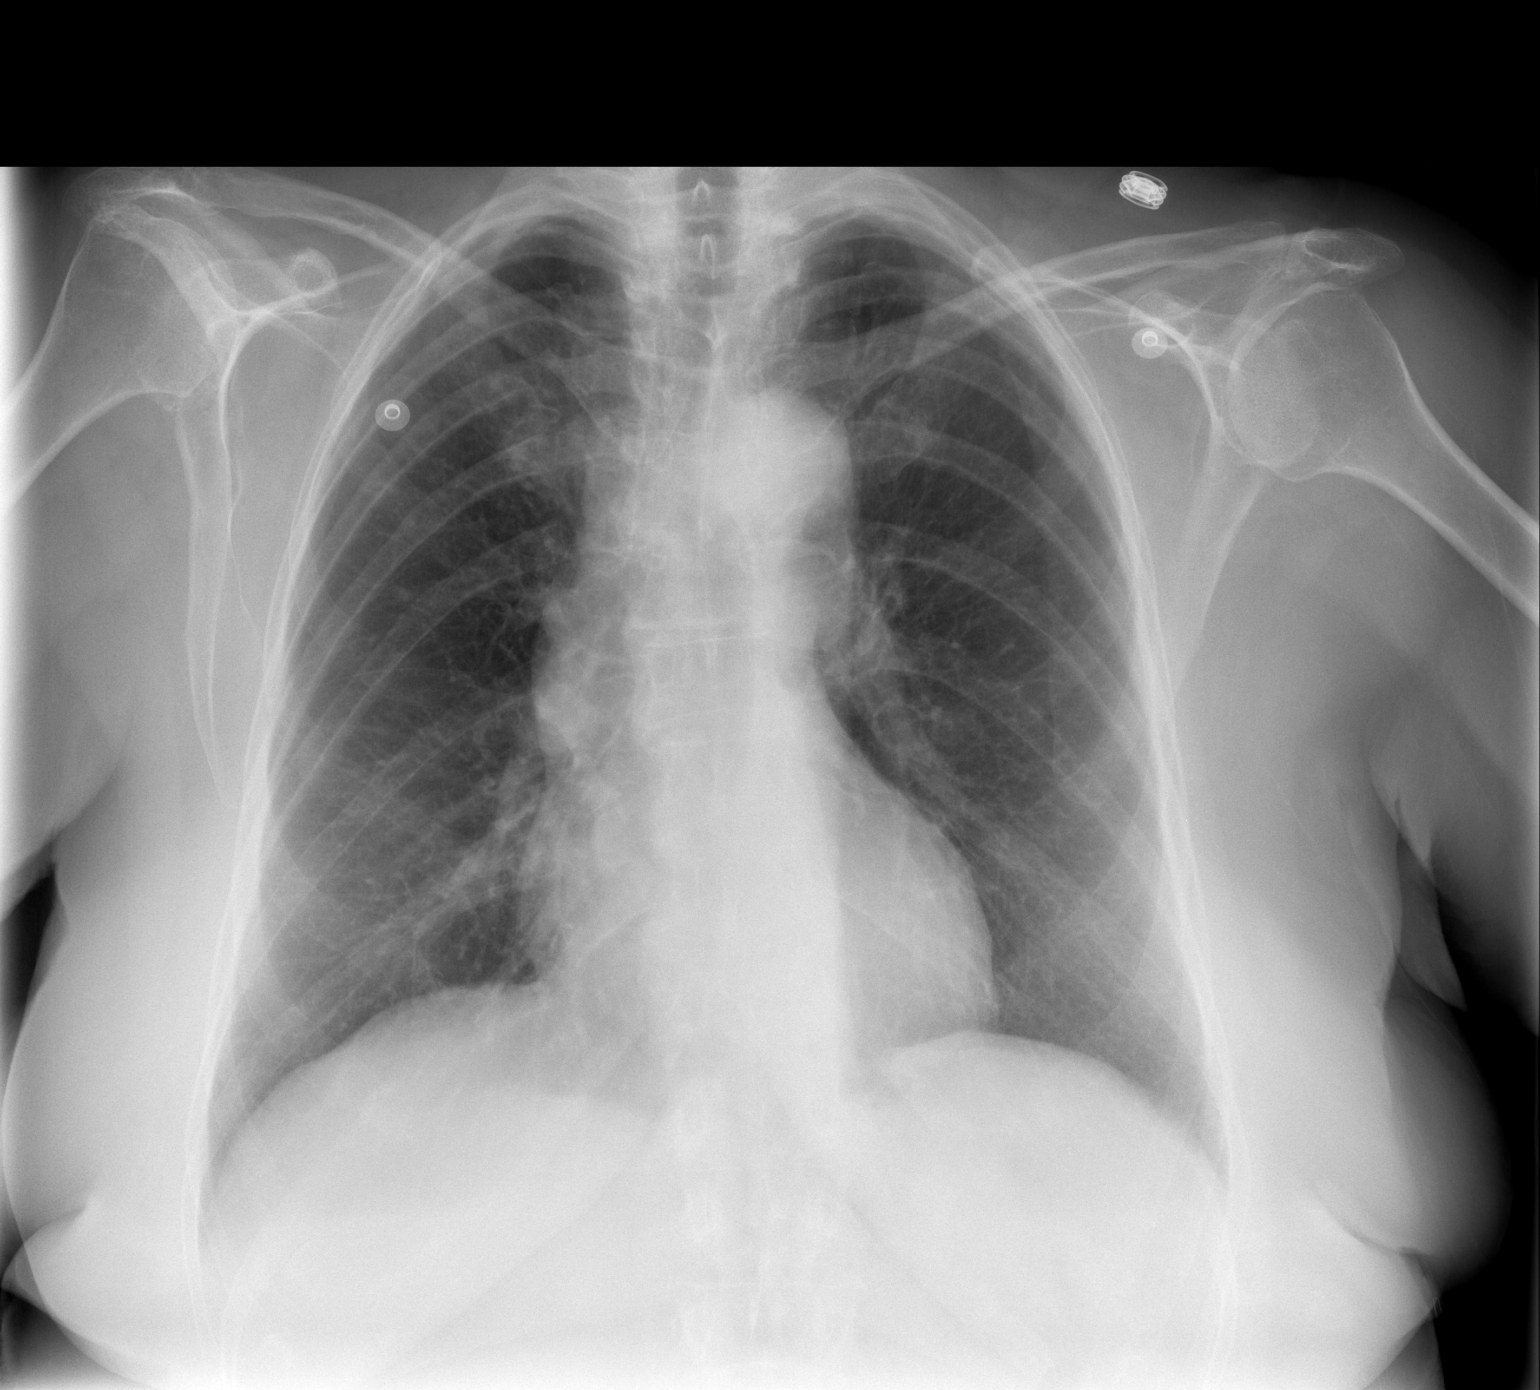

[w chest lat]
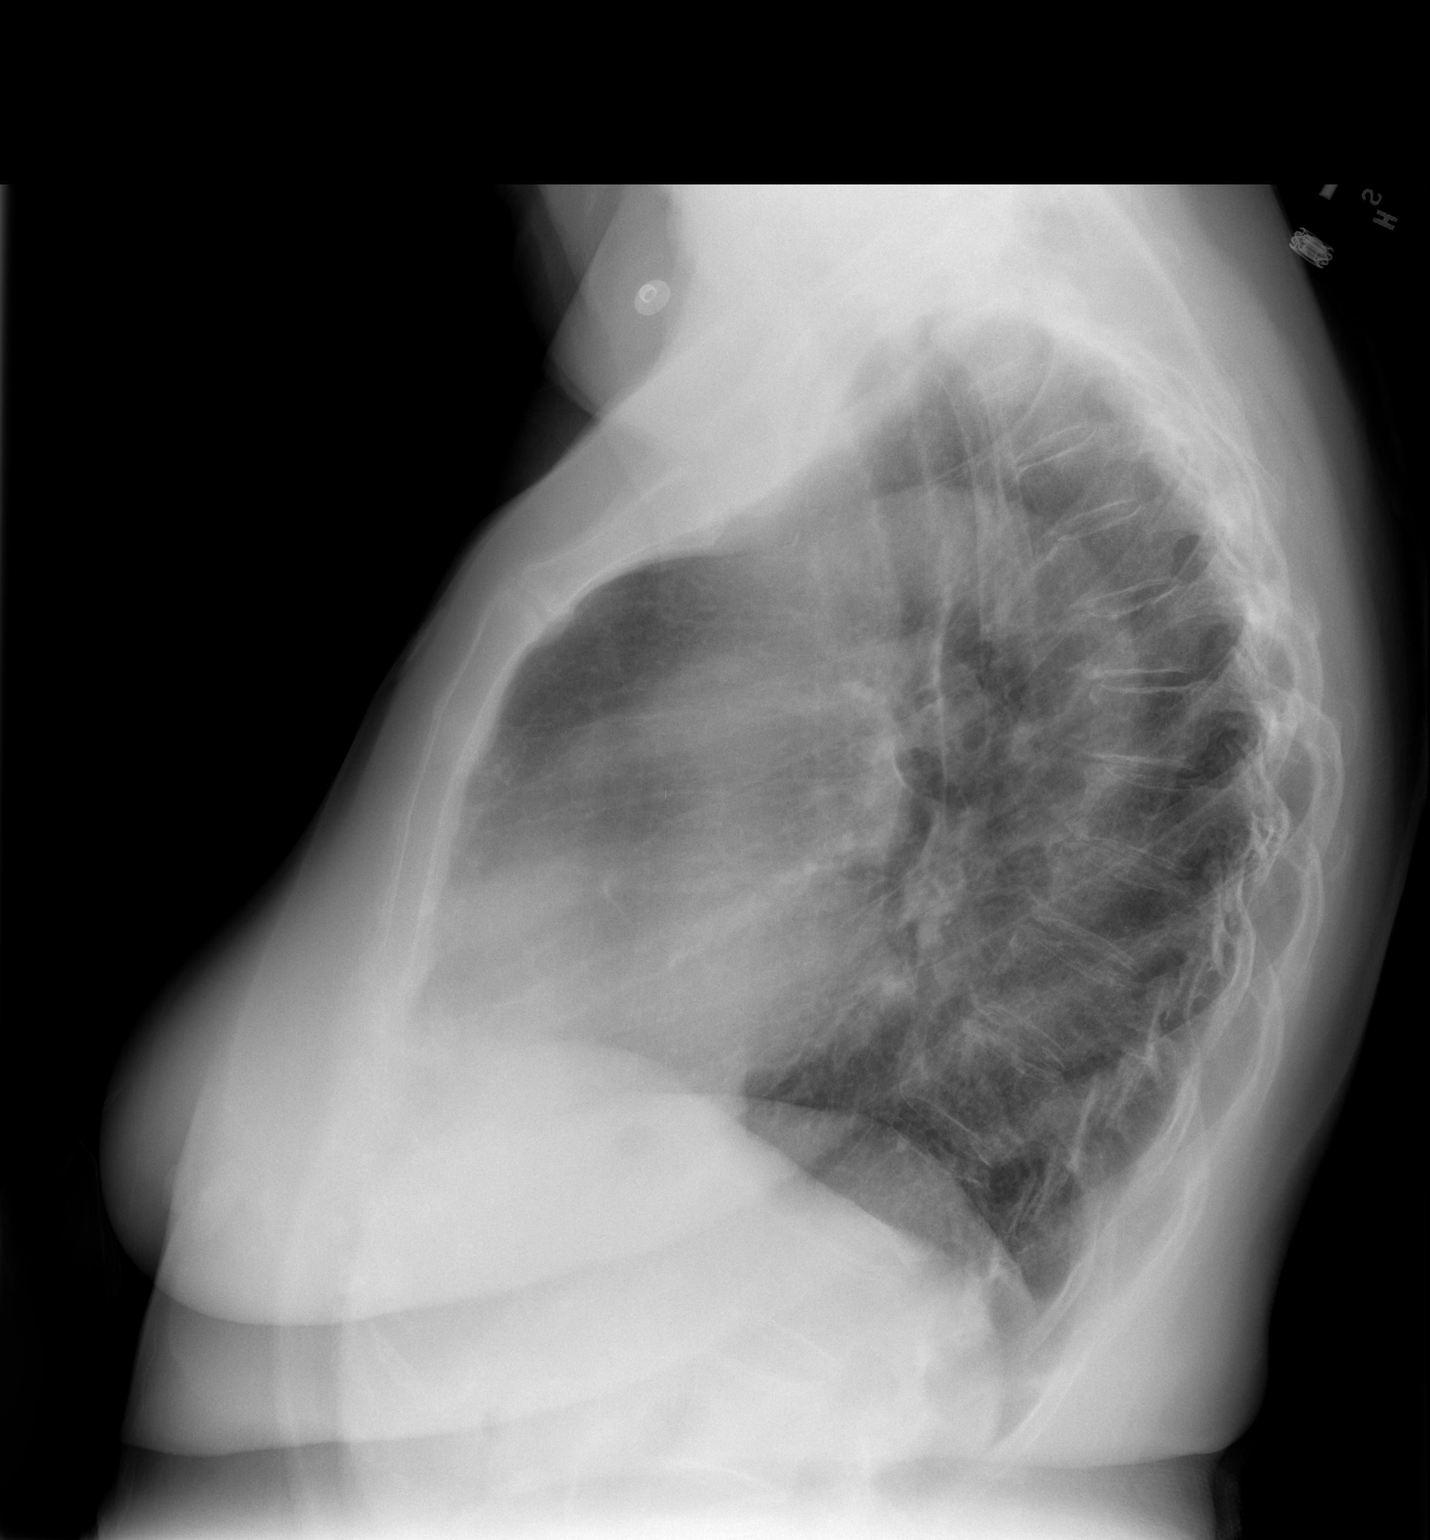

[2 of 2 positions shown; findings below may reference images not displayed]

FINDINGS: Minimal linear right lung opacity is favored to represent
atelectasis or scarring.  Otherwise lungs are clear.  No pleural
effusion or pneumothorax.  Cardiomediastinal contours within normal
limits.  High-riding right humeral head may be exaggerated by
positioning however, cannot exclude a prior rotator cuff injury.
Mild multilevel degenerative changes.
IMPRESSION: Mild right lung base opacity is likely scarring or atelectasis.  No
definite acute process.

## 2013-09-06 DIAGNOSIS — L821 Other seborrheic keratosis: Secondary | ICD-10-CM | POA: Diagnosis not present

## 2013-09-06 DIAGNOSIS — L723 Sebaceous cyst: Secondary | ICD-10-CM | POA: Diagnosis not present

## 2013-09-06 DIAGNOSIS — L570 Actinic keratosis: Secondary | ICD-10-CM | POA: Diagnosis not present

## 2013-09-06 DIAGNOSIS — D235 Other benign neoplasm of skin of trunk: Secondary | ICD-10-CM | POA: Diagnosis not present

## 2013-12-09 ENCOUNTER — Other Ambulatory Visit: Payer: Self-pay | Admitting: Family Medicine

## 2013-12-09 DIAGNOSIS — R739 Hyperglycemia, unspecified: Secondary | ICD-10-CM

## 2013-12-09 DIAGNOSIS — E785 Hyperlipidemia, unspecified: Secondary | ICD-10-CM

## 2013-12-11 ENCOUNTER — Other Ambulatory Visit (INDEPENDENT_AMBULATORY_CARE_PROVIDER_SITE_OTHER): Payer: Medicare Other

## 2013-12-11 DIAGNOSIS — R7309 Other abnormal glucose: Secondary | ICD-10-CM | POA: Diagnosis not present

## 2013-12-11 DIAGNOSIS — E785 Hyperlipidemia, unspecified: Secondary | ICD-10-CM

## 2013-12-11 DIAGNOSIS — R739 Hyperglycemia, unspecified: Secondary | ICD-10-CM

## 2013-12-11 LAB — BASIC METABOLIC PANEL
BUN: 17 mg/dL (ref 6–23)
CO2: 24 meq/L (ref 19–32)
Calcium: 8.9 mg/dL (ref 8.4–10.5)
Chloride: 102 mEq/L (ref 96–112)
Creatinine, Ser: 1 mg/dL (ref 0.4–1.2)
GFR: 59.71 mL/min — ABNORMAL LOW (ref >60.00)
GLUCOSE: 94 mg/dL (ref 70–99)
POTASSIUM: 3.9 meq/L (ref 3.5–5.1)
Sodium: 134 mEq/L — ABNORMAL LOW (ref 135–145)

## 2013-12-11 LAB — LIPID PANEL
CHOL/HDL RATIO: 5
Cholesterol: 216 mg/dL — ABNORMAL HIGH (ref 0–200)
HDL: 39.9 mg/dL (ref >39.00)
LDL CALC: 157 mg/dL — AB (ref 0–99)
NONHDL: 176.1
Triglycerides: 96 mg/dL (ref 0.0–149.0)
VLDL: 19.2 mg/dL (ref 0.0–40.0)

## 2013-12-11 LAB — HEMOGLOBIN A1C: Hgb A1c MFr Bld: 6.3 % (ref 4.6–6.5)

## 2013-12-18 ENCOUNTER — Ambulatory Visit (INDEPENDENT_AMBULATORY_CARE_PROVIDER_SITE_OTHER): Payer: Medicare Other | Admitting: Family Medicine

## 2013-12-18 ENCOUNTER — Encounter: Payer: Self-pay | Admitting: Family Medicine

## 2013-12-18 VITALS — BP 142/72 | HR 77 | Temp 97.9°F | Ht 63.25 in | Wt 183.5 lb

## 2013-12-18 DIAGNOSIS — Z0000 Encounter for general adult medical examination without abnormal findings: Secondary | ICD-10-CM

## 2013-12-18 DIAGNOSIS — R7309 Other abnormal glucose: Secondary | ICD-10-CM

## 2013-12-18 DIAGNOSIS — M810 Age-related osteoporosis without current pathological fracture: Secondary | ICD-10-CM

## 2013-12-18 DIAGNOSIS — Z7189 Other specified counseling: Secondary | ICD-10-CM

## 2013-12-18 DIAGNOSIS — Z23 Encounter for immunization: Secondary | ICD-10-CM | POA: Diagnosis not present

## 2013-12-18 DIAGNOSIS — R413 Other amnesia: Secondary | ICD-10-CM

## 2013-12-18 DIAGNOSIS — R739 Hyperglycemia, unspecified: Secondary | ICD-10-CM

## 2013-12-18 NOTE — Patient Instructions (Addendum)
It's okay not to get the bone density test done.  If you don't get a letter about the mammogram in September, then let me know.  Drink more water and cut back on the soda.  I would get a flu shot each fall.   Take care. Glad to see you.

## 2013-12-18 NOTE — Progress Notes (Signed)
Pre visit review using our clinic review tool, if applicable. No additional management support is needed unless otherwise documented below in the visit note.  I have personally reviewed the Medicare Annual Wellness questionnaire and have noted 1. The patient's medical and social history 2. Their use of alcohol, tobacco or illicit drugs 3. Their current medications and supplements 4. The patient's functional ability including ADL's, fall risks, home safety risks and hearing or visual             impairment. 5. Diet and physical activities 6. Evidence for depression or mood disorders  The patients weight, height, BMI have been recorded in the chart and visual acuity is per eye clinic.  I have made referrals, counseling and provided education to the patient based review of the above and I have provided the pt with a written personalized care plan for preventive services.  Provider list updated- see scanned forms.  Routine anticipatory guidance given to patient.  See health maintenance.  Flu prev done Shingles prev done PNA 2012 Tetanus 2010 Colonoscopy 2011 Breast cancer screening done prev, due 01/2014, she'll call about that.  Advance directive- son designated if patient were incapacitated.  Cognitive function addressed- see scanned forms- and if abnormal then additional documentation follows.   Memory testing wnl, 3/3 on recall, rapid response.  She asked about my family members by name.  Compliant with meds, no ADE.   Mild inc in sugar noted, labs d/w pt.  D/w pt about diet and cutting back on carbs.   PMH and SH reviewed  Meds, vitals, and allergies reviewed.   ROS: See HPI.  Otherwise negative.    GEN: nad, alert and oriented HEENT: mucous membranes moist NECK: supple w/o LA CV: rrr.  PULM: ctab, no inc wob ABD: soft, +bs EXT: no edema SKIN: no acute rash

## 2013-12-20 DIAGNOSIS — Z0000 Encounter for general adult medical examination without abnormal findings: Secondary | ICD-10-CM | POA: Insufficient documentation

## 2013-12-20 DIAGNOSIS — Z7189 Other specified counseling: Secondary | ICD-10-CM | POA: Insufficient documentation

## 2013-12-20 HISTORY — DX: Other specified counseling: Z71.89

## 2013-12-20 HISTORY — DX: Encounter for general adult medical examination without abnormal findings: Z00.00

## 2013-12-20 NOTE — Assessment & Plan Note (Signed)
Flu prev done Shingles prev done PNA 2012 Tetanus 2010 Colonoscopy 2011 Breast cancer screening done prev, due 01/2014, she'll call about that.  Advance directive- son designated if patient were incapacitated.  Cognitive function addressed- see scanned forms- and if abnormal then additional documentation follows.

## 2013-12-20 NOTE — Assessment & Plan Note (Signed)
Stable, continue as is.   

## 2013-12-20 NOTE — Assessment & Plan Note (Signed)
labs d/w pt. D/w pt about diet and cutting back on carbs.

## 2013-12-20 NOTE — Assessment & Plan Note (Signed)
Repeat DXA declined by patient.  This is reasonable.

## 2014-01-05 DIAGNOSIS — H40009 Preglaucoma, unspecified, unspecified eye: Secondary | ICD-10-CM | POA: Diagnosis not present

## 2014-01-05 IMAGING — CR DG SHOULDER 2+V*R*
4 series · 4 of 4 positions shown · non-contrast
Comparison: None.

CLINICAL DATA: Right shoulder pain

RIGHT SHOULDER - 2+ VIEW

[view not recorded (1 of 4)]
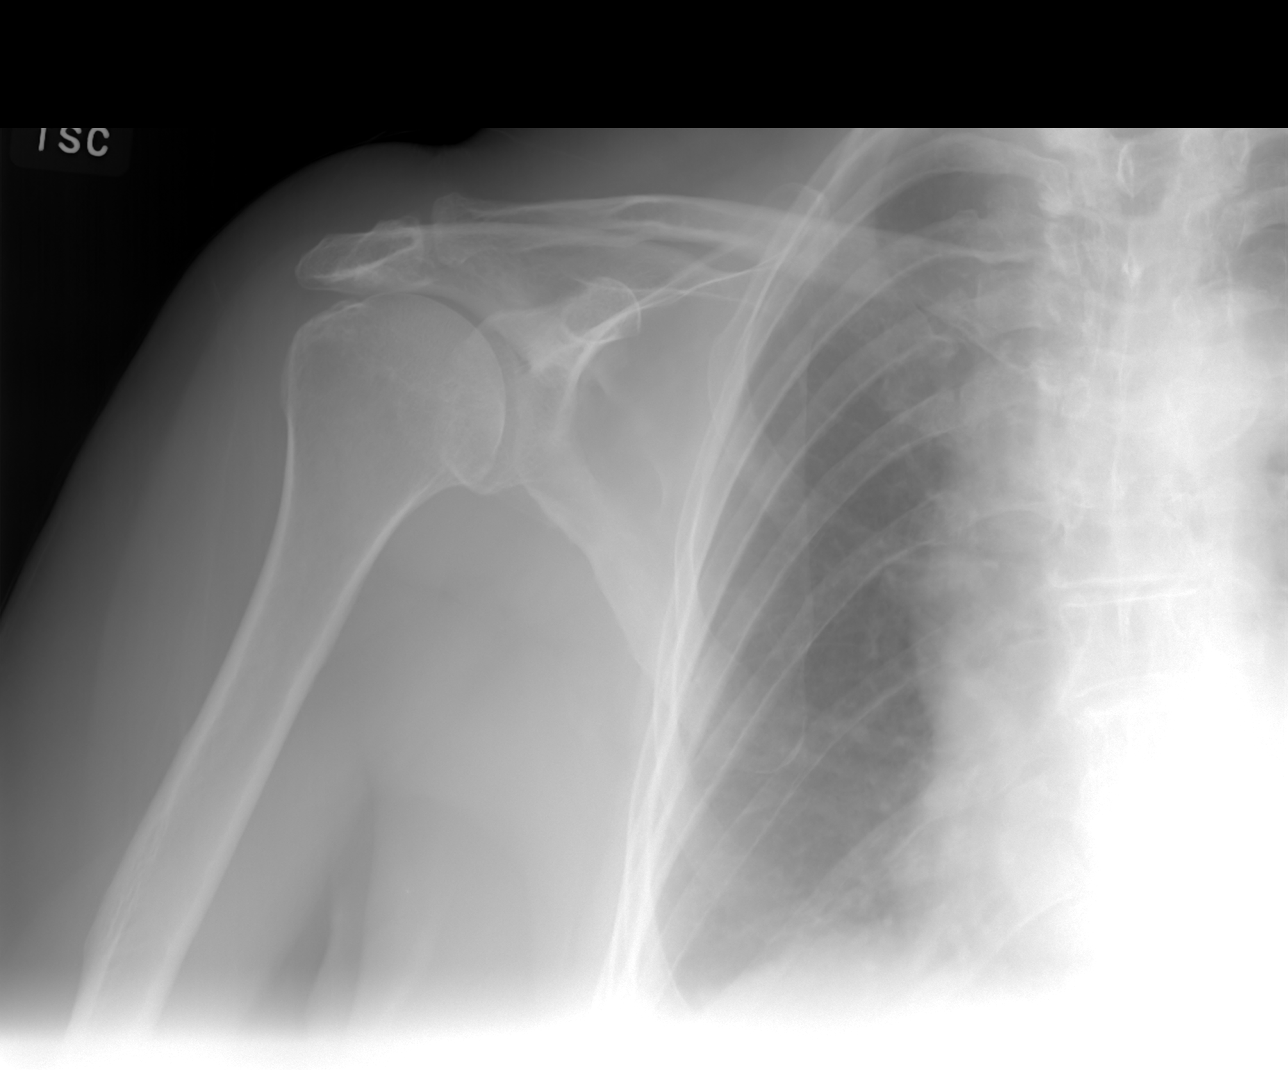

[view not recorded (2 of 4)]
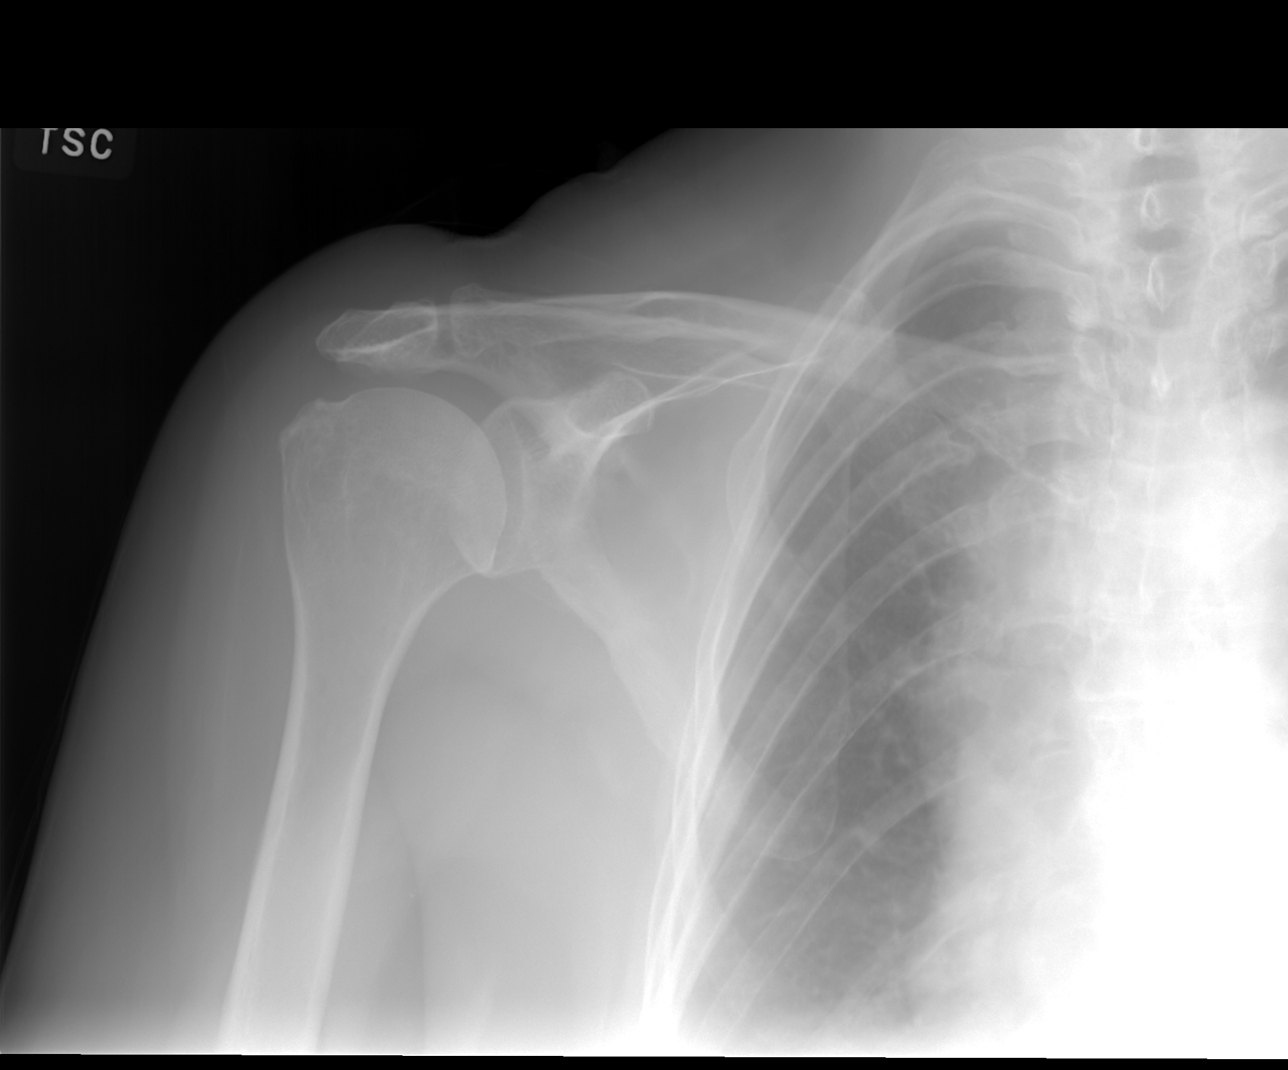

[view not recorded (3 of 4)]
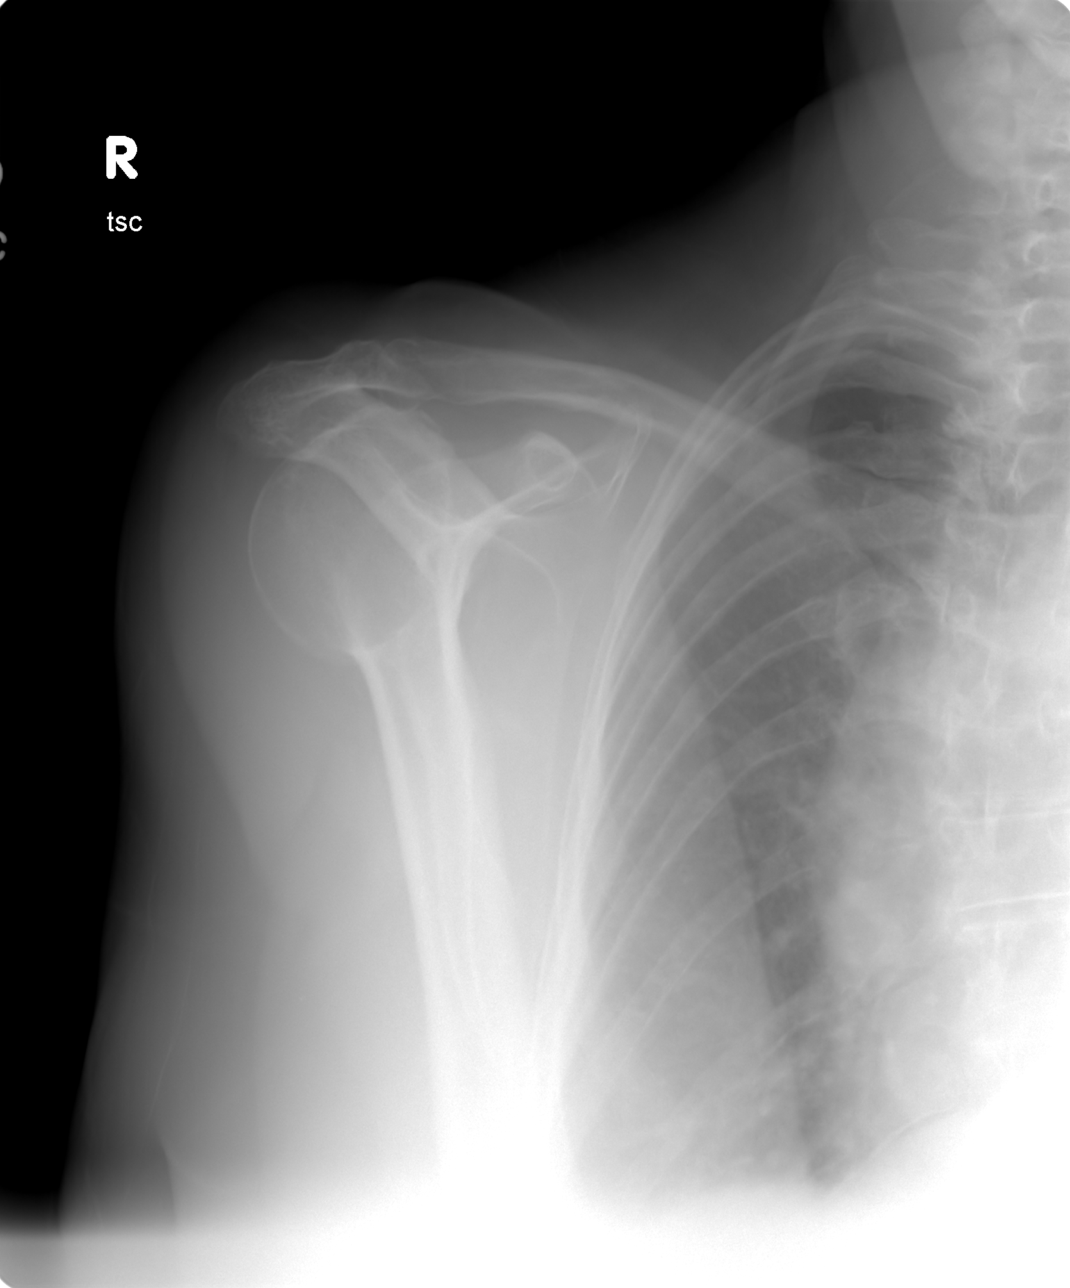

[view not recorded (4 of 4)]
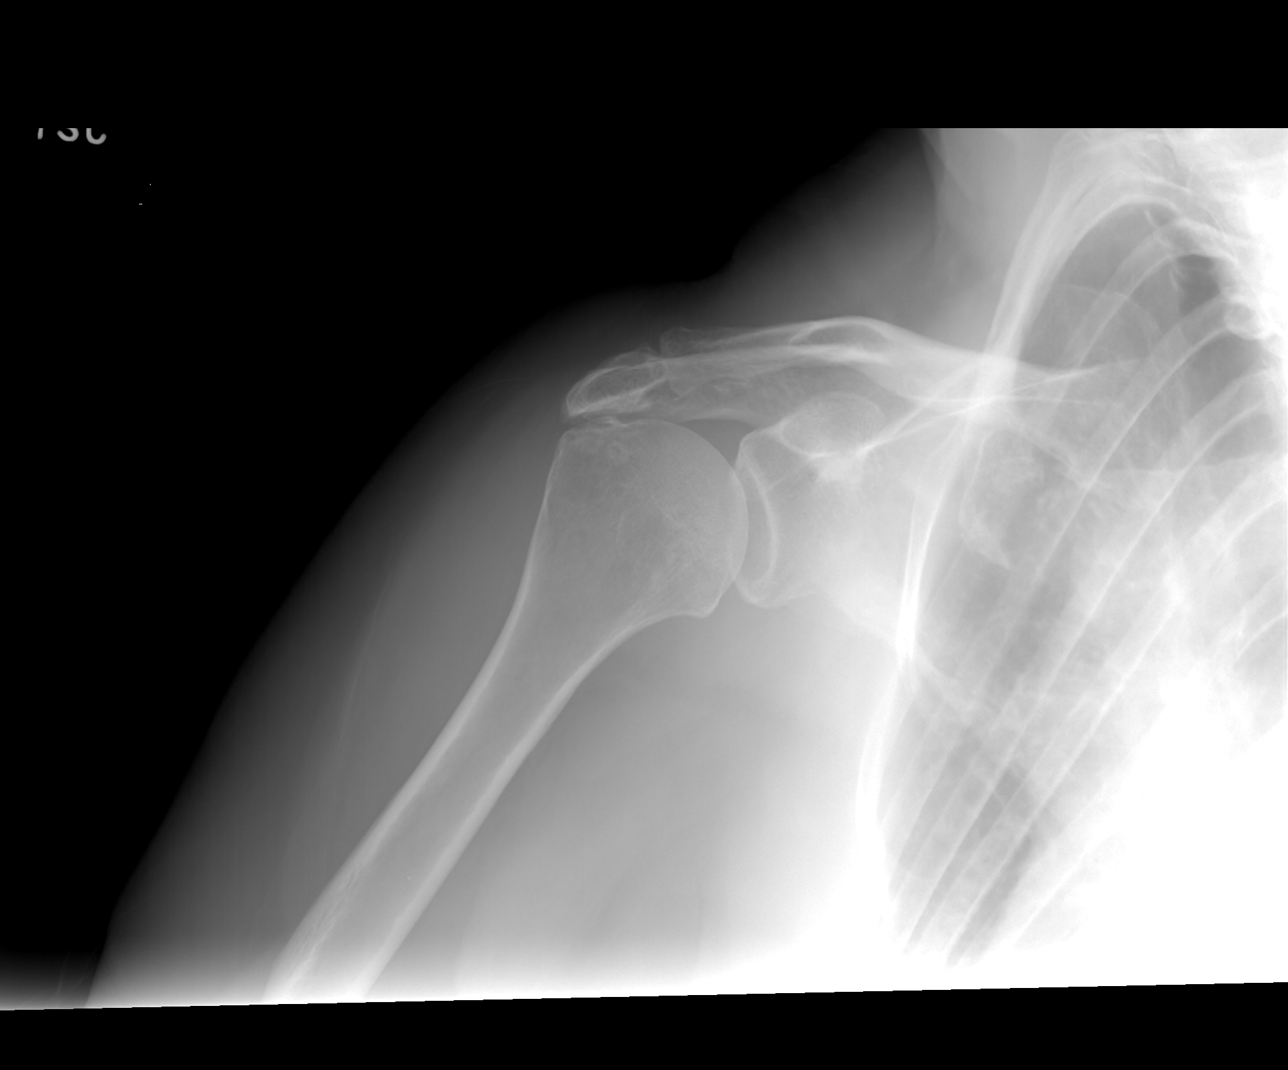

[4 of 4 positions shown; findings below may reference images not displayed]

FINDINGS: There is mild osteoarthritis involving the
acromioclavicular and glenohumeral joint.

There are no acute fractures or subluxations identified.

No radio-opaque foreign bodies or soft tissue calcifications.
IMPRESSION: 1.  Mild osteoarthritis.
2.  No acute findings.

## 2014-01-24 DIAGNOSIS — Z1231 Encounter for screening mammogram for malignant neoplasm of breast: Secondary | ICD-10-CM | POA: Diagnosis not present

## 2014-01-25 ENCOUNTER — Encounter: Payer: Self-pay | Admitting: Family Medicine

## 2014-02-09 DIAGNOSIS — Z23 Encounter for immunization: Secondary | ICD-10-CM | POA: Diagnosis not present

## 2014-02-19 ENCOUNTER — Telehealth: Payer: Self-pay | Admitting: *Deleted

## 2014-02-19 DIAGNOSIS — N760 Acute vaginitis: Principal | ICD-10-CM

## 2014-02-19 DIAGNOSIS — B9689 Other specified bacterial agents as the cause of diseases classified elsewhere: Secondary | ICD-10-CM

## 2014-02-19 MED ORDER — METRONIDAZOLE 500 MG PO TABS: 500.0000 mg | ORAL_TABLET | Freq: Two times a day (BID) | ORAL | Status: DC

## 2014-02-19 NOTE — Telephone Encounter (Signed)
Patient is having increased discharge with a foul fishy odor.  She would like medication called in.  Metronidazole called into patients pharmacy and she will call back to be seen if her symptoms persist or change.

## 2014-02-26 DIAGNOSIS — H40001 Preglaucoma, unspecified, right eye: Secondary | ICD-10-CM | POA: Diagnosis not present

## 2014-03-09 DIAGNOSIS — H16223 Keratoconjunctivitis sicca, not specified as Sjogren's, bilateral: Secondary | ICD-10-CM | POA: Diagnosis not present

## 2014-03-09 DIAGNOSIS — H43813 Vitreous degeneration, bilateral: Secondary | ICD-10-CM | POA: Diagnosis not present

## 2014-03-09 DIAGNOSIS — H4011X3 Primary open-angle glaucoma, severe stage: Secondary | ICD-10-CM | POA: Diagnosis not present

## 2014-03-12 ENCOUNTER — Encounter: Payer: Self-pay | Admitting: Family Medicine

## 2014-05-28 ENCOUNTER — Other Ambulatory Visit: Payer: Self-pay | Admitting: Family Medicine

## 2014-06-22 ENCOUNTER — Encounter: Payer: Self-pay | Admitting: Obstetrics & Gynecology

## 2014-06-22 ENCOUNTER — Ambulatory Visit (INDEPENDENT_AMBULATORY_CARE_PROVIDER_SITE_OTHER): Payer: Medicare Other | Admitting: Obstetrics & Gynecology

## 2014-06-22 VITALS — BP 181/83 | HR 79 | Ht 64.0 in | Wt 185.2 lb

## 2014-06-22 DIAGNOSIS — N952 Postmenopausal atrophic vaginitis: Secondary | ICD-10-CM

## 2014-06-22 DIAGNOSIS — N898 Other specified noninflammatory disorders of vagina: Secondary | ICD-10-CM

## 2014-06-22 DIAGNOSIS — N9489 Other specified conditions associated with female genital organs and menstrual cycle: Secondary | ICD-10-CM | POA: Diagnosis not present

## 2014-06-22 MED ORDER — ESTRADIOL 10 MCG VA TABS: ORAL_TABLET | VAGINAL | Status: DC

## 2014-06-22 NOTE — Progress Notes (Signed)
   Subjective:    Patient ID: Katrina Armstrong, female    DOB: Jan 24, 1929, 81 y.o.   MRN: [id number]  HPI  This lovely 81 yo lady comes in with the complaint of 5 days of unpleasant vaginal odor.   Review of Systems She is abstinent.    Objective:   Physical Exam  WNWHWFNAD Breathing normally Neuro intact  EG- very atrophic, small introitus and vagina Absolutely NO discharge, no odor appreciated (She took a shower this morning and denies douching)      Assessment & Plan:  Vaginal odor- I sent a wet prep But I think that if the atrophy is fixed that her symptoms may be relieved. I have prescribed Vagifem, and given her samples of probiotics and RepHresh RTC prn

## 2014-06-22 NOTE — Addendum Note (Signed)
Addended by: Erik Obey on: 06/22/2014 10:11 AM   Modules accepted: Orders

## 2014-06-23 LAB — WET PREP FOR TRICH, YEAST, CLUE
Clue Cells Wet Prep HPF POC: NONE SEEN
Trich, Wet Prep: NONE SEEN
WBC, Wet Prep HPF POC: NONE SEEN
YEAST WET PREP: NONE SEEN

## 2014-09-11 DIAGNOSIS — H4011X3 Primary open-angle glaucoma, severe stage: Secondary | ICD-10-CM | POA: Diagnosis not present

## 2014-10-03 ENCOUNTER — Other Ambulatory Visit: Payer: Self-pay | Admitting: Family Medicine

## 2014-10-03 DIAGNOSIS — Z808 Family history of malignant neoplasm of other organs or systems: Secondary | ICD-10-CM | POA: Diagnosis not present

## 2014-10-03 DIAGNOSIS — L821 Other seborrheic keratosis: Secondary | ICD-10-CM | POA: Diagnosis not present

## 2014-10-03 DIAGNOSIS — L570 Actinic keratosis: Secondary | ICD-10-CM | POA: Diagnosis not present

## 2014-10-05 DIAGNOSIS — H4011X3 Primary open-angle glaucoma, severe stage: Secondary | ICD-10-CM | POA: Diagnosis not present

## 2014-10-24 DIAGNOSIS — H4011X3 Primary open-angle glaucoma, severe stage: Secondary | ICD-10-CM | POA: Diagnosis not present

## 2014-11-28 ENCOUNTER — Other Ambulatory Visit: Payer: Self-pay | Admitting: Family Medicine

## 2014-12-19 ENCOUNTER — Other Ambulatory Visit: Payer: Self-pay | Admitting: Family Medicine

## 2014-12-19 DIAGNOSIS — E785 Hyperlipidemia, unspecified: Secondary | ICD-10-CM

## 2014-12-19 DIAGNOSIS — R739 Hyperglycemia, unspecified: Secondary | ICD-10-CM

## 2014-12-24 ENCOUNTER — Other Ambulatory Visit (INDEPENDENT_AMBULATORY_CARE_PROVIDER_SITE_OTHER): Payer: Medicare Other

## 2014-12-24 DIAGNOSIS — R739 Hyperglycemia, unspecified: Secondary | ICD-10-CM

## 2014-12-24 DIAGNOSIS — E785 Hyperlipidemia, unspecified: Secondary | ICD-10-CM | POA: Diagnosis not present

## 2014-12-24 LAB — LIPID PANEL
Cholesterol: 195 mg/dL (ref 0–200)
HDL: 40.9 mg/dL (ref >39.00)
LDL Cholesterol: 132 mg/dL — ABNORMAL HIGH (ref 0–99)
NONHDL: 154.5
Total CHOL/HDL Ratio: 5
Triglycerides: 112 mg/dL (ref 0.0–149.0)
VLDL: 22.4 mg/dL (ref 0.0–40.0)

## 2014-12-24 LAB — COMPREHENSIVE METABOLIC PANEL
ALBUMIN: 3.9 g/dL (ref 3.5–5.2)
ALK PHOS: 55 U/L (ref 39–117)
ALT: 20 U/L (ref 0–35)
AST: 24 U/L (ref 0–37)
BUN: 13 mg/dL (ref 6–23)
CO2: 28 mEq/L (ref 19–32)
CREATININE: 0.9 mg/dL (ref 0.40–1.20)
Calcium: 9.5 mg/dL (ref 8.4–10.5)
Chloride: 104 mEq/L (ref 96–112)
GFR: 63.39 mL/min (ref >60.00)
Glucose, Bld: 100 mg/dL — ABNORMAL HIGH (ref 70–99)
Potassium: 4.1 mEq/L (ref 3.5–5.1)
SODIUM: 138 meq/L (ref 135–145)
TOTAL PROTEIN: 6.8 g/dL (ref 6.0–8.3)
Total Bilirubin: 0.4 mg/dL (ref 0.2–1.2)

## 2014-12-24 LAB — HEMOGLOBIN A1C: Hgb A1c MFr Bld: 6 % (ref 4.6–6.5)

## 2014-12-31 ENCOUNTER — Ambulatory Visit (INDEPENDENT_AMBULATORY_CARE_PROVIDER_SITE_OTHER)
Admission: RE | Admit: 2014-12-31 | Discharge: 2014-12-31 | Disposition: A | Discharge status: Home / Self Care | Payer: Medicare Other | Source: Ambulatory Visit | Attending: Family Medicine | Admitting: Family Medicine

## 2014-12-31 ENCOUNTER — Ambulatory Visit (INDEPENDENT_AMBULATORY_CARE_PROVIDER_SITE_OTHER): Payer: Medicare Other | Admitting: Family Medicine

## 2014-12-31 ENCOUNTER — Encounter: Payer: Self-pay | Admitting: Family Medicine

## 2014-12-31 VITALS — BP 130/70 | HR 63 | Temp 98.1°F | Wt 184.5 lb

## 2014-12-31 DIAGNOSIS — M25551 Pain in right hip: Secondary | ICD-10-CM

## 2014-12-31 DIAGNOSIS — M7061 Trochanteric bursitis, right hip: Secondary | ICD-10-CM

## 2014-12-31 DIAGNOSIS — R413 Other amnesia: Secondary | ICD-10-CM

## 2014-12-31 DIAGNOSIS — K219 Gastro-esophageal reflux disease without esophagitis: Secondary | ICD-10-CM | POA: Diagnosis not present

## 2014-12-31 DIAGNOSIS — Z0000 Encounter for general adult medical examination without abnormal findings: Secondary | ICD-10-CM

## 2014-12-31 DIAGNOSIS — Z7189 Other specified counseling: Secondary | ICD-10-CM

## 2014-12-31 DIAGNOSIS — M1611 Unilateral primary osteoarthritis, right hip: Secondary | ICD-10-CM | POA: Diagnosis not present

## 2014-12-31 MED ORDER — OMEPRAZOLE 20 MG PO CPDR: DELAYED_RELEASE_CAPSULE | ORAL | Status: DC

## 2014-12-31 MED ORDER — DONEPEZIL HCL 10 MG PO TABS: 10.0000 mg | ORAL_TABLET | Freq: Every day | ORAL | Status: DC

## 2014-12-31 NOTE — Progress Notes (Signed)
Pre visit review using our clinic review tool, if applicable. No additional management support is needed unless otherwise documented below in the visit note.  I have personally reviewed the information associated with the Medicare Annual Wellness questionnaire and have noted 1. The patient's medical and social history- updated 2. Their use of alcohol, tobacco or illicit drugs- none x3 3. Their current medications and supplements- EMR updated.   4. The patient's functional ability including ADL's, fall risks, home safety risks and hearing or visual             Impairment. See below.   5. Diet and physical activities- d/w pt.   6. Evidence for depression or mood disorders- mood is stable.   The patients weight, height, BMI have been recorded in the chart and visual acuity is per eye clinic.  I have made referrals, counseling and provided education to the patient based review of the above and I have provided the pt with a written personalized care plan for preventive services.  Provider list updated.  Routine anticipatory guidance given to patient.  See health maintenance.  Flu encouraged Shingles prev done.  PNA up to date Tetanus 2010 Colonoscopy NA due to age.   Breast cancer screening 01/2014.  She'll schedule when she gets a letter or a call.   DXA d/w pt.  She didn't want to go through with more treatment and that would eliminate the need for f/u DXA.  This is reasonable. D/w pt about calcium and vit D.  We can defer DXA for now.  Advance directive- son [name] designated if patient were incapacitated.  Cognitive function addressed- see scanned forms- and if abnormal then additional documentation follows.  No falls, mood is okay.  Hearing is good except for high pitches with background noise.  Still doing household chores.  Last eye exam was 2 months ago, and she has f/u pending.    GERD controlled with prilosec.  Not needed daily but likely QOD.  No ADE.  Compliant.    Memory loss.  She  isn't worse in the meantime.  Still independent.  Still with troubles with names but she recalled my family members name, when asking about them.  Not getting lost, not leaving the stove on.  No ADE on aricept.    R hip pain.  Going on for about 1 month.  She has used a leg press at the gym and she didn't know if she overloaded weight once.  Still walking, able to bear weight.  No trauma.  No L sided pain.  R lateral hip and R buttock pain.  Hasn't iced it down or done anything else for it.  No weakness.  Laying on R side at night hurts.    PMH and SH reviewed  Meds, vitals, and allergies reviewed.   ROS: See HPI.  Otherwise negative.    GEN: nad, alert and oriented HEENT: mucous membranes moist NECK: supple w/o LA CV: rrr. PULM: ctab, no inc wob ABD: soft, +bs EXT: no edema SKIN: no acute rash R hip exam: normal ROM, no weakness, ttp near greater troch, but no pain in int/ext rotation.  SLR neg, distally grossly NV intact.   3/3 recall A&Ox3 Can do math.   Can read a watch.

## 2014-12-31 NOTE — Patient Instructions (Signed)
Take care.  Glad to see you.  I would get a flu shot each fall.   Don't change your meds for now.  Recheck in about 1 year, sooner if needed.

## 2015-01-01 DIAGNOSIS — M7061 Trochanteric bursitis, right hip: Secondary | ICD-10-CM

## 2015-01-01 HISTORY — DX: Trochanteric bursitis, right hip: M70.61

## 2015-01-01 NOTE — Assessment & Plan Note (Signed)
Controlled with QOD dosing, continue as is. Doing well.  She agrees.

## 2015-01-01 NOTE — Assessment & Plan Note (Signed)
She isn't worse in the meantime, no recent changes. Still independent. Still with troubles with names but she recalled my family members name, when asking about them. Not getting lost, not leaving the stove on. No ADE on aricept.  Continue as is.

## 2015-01-01 NOTE — Assessment & Plan Note (Signed)
See xray report- independently reviewed and I agree.   No acute findings.  Likely bursitis.  Try tylenol/ibuprofen with routine cautions, can use ice.   Update me as needed.

## 2015-01-01 NOTE — Assessment & Plan Note (Signed)
Flu encouraged Shingles prev done.  PNA up to date Tetanus 2010 Colonoscopy NA due to age.   Breast cancer screening 01/2014.  She'll schedule when she gets a letter or a call.   DXA d/w pt.  She didn't want to go through with more treatment and that would eliminate the need for f/u DXA.  This is reasonable. D/w pt about calcium and vit D.  We can defer DXA for now.  Advance directive- son [name] designated if patient were incapacitated.  Cognitive function addressed- see scanned forms- and if abnormal then additional documentation follows.  No falls, mood is okay.  Hearing is good except for high pitches with background noise.  Still doing household chores.  Last eye exam was 2 months ago, and she has f/u pending.

## 2015-01-07 DIAGNOSIS — H40001 Preglaucoma, unspecified, right eye: Secondary | ICD-10-CM | POA: Diagnosis not present

## 2015-02-04 DIAGNOSIS — Z23 Encounter for immunization: Secondary | ICD-10-CM | POA: Diagnosis not present

## 2015-02-04 DIAGNOSIS — Z1231 Encounter for screening mammogram for malignant neoplasm of breast: Secondary | ICD-10-CM | POA: Diagnosis not present

## 2015-02-14 ENCOUNTER — Encounter: Payer: Self-pay | Admitting: Family Medicine

## 2015-03-13 DIAGNOSIS — L821 Other seborrheic keratosis: Secondary | ICD-10-CM | POA: Diagnosis not present

## 2015-03-13 DIAGNOSIS — L720 Epidermal cyst: Secondary | ICD-10-CM | POA: Diagnosis not present

## 2015-06-19 ENCOUNTER — Encounter (HOSPITAL_COMMUNITY): Payer: Self-pay | Admitting: Emergency Medicine

## 2015-06-19 ENCOUNTER — Emergency Department (HOSPITAL_COMMUNITY): Payer: Medicare Other

## 2015-06-19 ENCOUNTER — Emergency Department (HOSPITAL_COMMUNITY)
Admission: EM | Admit: 2015-06-19 | Discharge: 2015-06-19 | Disposition: A | Discharge status: Home / Self Care | Payer: Medicare Other | Attending: Emergency Medicine | Admitting: Emergency Medicine

## 2015-06-19 DIAGNOSIS — G309 Alzheimer's disease, unspecified: Secondary | ICD-10-CM | POA: Diagnosis not present

## 2015-06-19 DIAGNOSIS — Z87891 Personal history of nicotine dependence: Secondary | ICD-10-CM | POA: Insufficient documentation

## 2015-06-19 DIAGNOSIS — R200 Anesthesia of skin: Secondary | ICD-10-CM | POA: Diagnosis present

## 2015-06-19 DIAGNOSIS — M810 Age-related osteoporosis without current pathological fracture: Secondary | ICD-10-CM | POA: Insufficient documentation

## 2015-06-19 DIAGNOSIS — Z86711 Personal history of pulmonary embolism: Secondary | ICD-10-CM | POA: Insufficient documentation

## 2015-06-19 DIAGNOSIS — Z79899 Other long term (current) drug therapy: Secondary | ICD-10-CM | POA: Insufficient documentation

## 2015-06-19 DIAGNOSIS — Z86010 Personal history of colonic polyps: Secondary | ICD-10-CM | POA: Diagnosis not present

## 2015-06-19 DIAGNOSIS — F0280 Dementia in other diseases classified elsewhere without behavioral disturbance: Secondary | ICD-10-CM | POA: Insufficient documentation

## 2015-06-19 DIAGNOSIS — E785 Hyperlipidemia, unspecified: Secondary | ICD-10-CM | POA: Insufficient documentation

## 2015-06-19 DIAGNOSIS — R030 Elevated blood-pressure reading, without diagnosis of hypertension: Secondary | ICD-10-CM | POA: Diagnosis not present

## 2015-06-19 DIAGNOSIS — R51 Headache: Secondary | ICD-10-CM | POA: Insufficient documentation

## 2015-06-19 DIAGNOSIS — R519 Headache, unspecified: Secondary | ICD-10-CM

## 2015-06-19 LAB — COMPREHENSIVE METABOLIC PANEL
ALBUMIN: 3.6 g/dL (ref 3.5–5.0)
ALK PHOS: 55 U/L (ref 38–126)
ALT: 20 U/L (ref 14–54)
ANION GAP: 10 (ref 5–15)
AST: 27 U/L (ref 15–41)
BILIRUBIN TOTAL: 0.4 mg/dL (ref 0.3–1.2)
BUN: 13 mg/dL (ref 6–20)
CALCIUM: 9.5 mg/dL (ref 8.9–10.3)
CO2: 27 mmol/L (ref 22–32)
Chloride: 105 mmol/L (ref 101–111)
Creatinine, Ser: 0.93 mg/dL (ref 0.44–1.00)
GFR calc Af Amer: >60 mL/min (ref >60)
GFR calc non Af Amer: 55 mL/min — ABNORMAL LOW (ref >60)
GLUCOSE: 112 mg/dL — AB (ref 65–99)
POTASSIUM: 4 mmol/L (ref 3.5–5.1)
SODIUM: 142 mmol/L (ref 135–145)
TOTAL PROTEIN: 6.7 g/dL (ref 6.5–8.1)

## 2015-06-19 LAB — PROTIME-INR
INR: 0.97 (ref 0.00–1.49)
Prothrombin Time: 13.1 seconds (ref 11.6–15.2)

## 2015-06-19 LAB — ETHANOL

## 2015-06-19 LAB — CBC WITH DIFFERENTIAL/PLATELET
Basophils Absolute: 0.1 10*3/uL (ref 0.0–0.1)
Basophils Relative: 1 %
Eosinophils Absolute: 0.1 10*3/uL (ref 0.0–0.7)
Eosinophils Relative: 1 %
HEMATOCRIT: 38.9 % (ref 36.0–46.0)
HEMOGLOBIN: 12.6 g/dL (ref 12.0–15.0)
LYMPHS ABS: 3.1 10*3/uL (ref 0.7–4.0)
LYMPHS PCT: 37 %
MCH: 27.9 pg (ref 26.0–34.0)
MCHC: 32.4 g/dL (ref 30.0–36.0)
MCV: 86.3 fL (ref 78.0–100.0)
MONOS PCT: 5 %
Monocytes Absolute: 0.4 10*3/uL (ref 0.1–1.0)
NEUTROS ABS: 4.7 10*3/uL (ref 1.7–7.7)
NEUTROS PCT: 56 %
Platelets: 208 10*3/uL (ref 150–400)
RBC: 4.51 MIL/uL (ref 3.87–5.11)
RDW: 14.8 % (ref 11.5–15.5)
WBC: 8.3 10*3/uL (ref 4.0–10.5)

## 2015-06-19 LAB — URINALYSIS, ROUTINE W REFLEX MICROSCOPIC
BILIRUBIN URINE: NEGATIVE
GLUCOSE, UA: NEGATIVE mg/dL
Hgb urine dipstick: NEGATIVE
KETONES UR: NEGATIVE mg/dL
Nitrite: NEGATIVE
PH: 5.5 (ref 5.0–8.0)
PROTEIN: NEGATIVE mg/dL
Specific Gravity, Urine: 1.005 (ref 1.005–1.030)

## 2015-06-19 LAB — URINE MICROSCOPIC-ADD ON

## 2015-06-19 LAB — I-STAT TROPONIN, ED: Troponin i, poc: 0 ng/mL (ref 0.00–0.08)

## 2015-06-19 LAB — RAPID URINE DRUG SCREEN, HOSP PERFORMED
AMPHETAMINES: NOT DETECTED
BARBITURATES: NOT DETECTED
Benzodiazepines: NOT DETECTED
COCAINE: NOT DETECTED
OPIATES: NOT DETECTED
TETRAHYDROCANNABINOL: NOT DETECTED

## 2015-06-19 LAB — APTT: aPTT: 29 seconds (ref 24–37)

## 2015-06-19 NOTE — ED Provider Notes (Signed)
PROGRESS NOTE                                                                                                                 This is a sign-out from NP Olean Ree at shift change: Katrina Armstrong is a 82 y.o. female presenting with episode of headache and face was twitching. This initially was resolved with taking aspirin. Patient's blood pressure was initially high however, this is resolved. Plan is to follow-up CBC and INR which were coagulated. This is a shared visit with attending who is personally evaluated this patient and agrees that she is appropriate for discharge.  Please refer to previous note for full HPI, ROS, PMH and PE. CBC and INR with no abnormality.     Monico Blitz, PA-C 06/19/15 [place], MD 06/20/15 [phone]

## 2015-06-19 NOTE — ED Provider Notes (Signed)
Patient states she is not sure if she was asleep because she wakes up frequently during the night however she noticed at one point the left side of her face was twitching. She stated felt funny and she took some aspirin and it went away. She cannot tell me how long the twitching lasted. She denies any numbness or twitching in her extremities although EMS stated that she did have it. She also reported that she had a headache but she cannot describe it now because it is gone now. She states she feels perfectly fine now. Patient is alert and cooperative, there may be some memory problem present. Her face is symmetrical, her neuro exam is without weakness or focal findings. Her blood pressure now is 128/68 without treatment. I suspect patient may have had a bump in her blood pressure with some temporary neurological symptoms.  Medical screening examination/treatment/procedure(s) were conducted as a shared visit with non-physician practitioner(s) and myself.  I personally evaluated the patient during the encounter.   EKG Interpretation   Date/Time:  Wednesday June 19 2015 04:11:26 EST Ventricular Rate:  64 PR Interval:  156 QRS Duration: 88 QT Interval:  420 QTC Calculation: 433 R Axis:   31 Text Interpretation:   Poor data quality, interpretation may be  adversely affected Normal sinus rhythm Normal ECG No significant change  since last tracing 14 Dec 2011 Confirmed by [facility]  MD-I, Karinna Beadles ([id number]) on  06/19/2015 6:56:27 AM       Rolland Porter, MD, Barbette Or, MD 06/19/15 [phone]

## 2015-06-19 NOTE — ED Provider Notes (Signed)
CSN: [id number]     Arrival date & time 06/19/15  0340 History   First MD Initiated Contact with Patient 06/19/15 [phone]     Chief Complaint  Patient presents with  . Numbness     (Consider location/radiation/quality/duration/timing/severity/associated sxs/prior Treatment) HPI Comments: Noticed at 1:30 that she had a headache and left facial numbness she took ASA and headache and numbness are subsiding.  Denies weakness of extremities, visual disturbance, no difficulty swallowing, speaking, no muddle thoughts She did take her blood pressure and found it to be elevated   The history is provided by the patient.    Past Medical History  Diagnosis Date  . Hyperlipidemia   . Memory loss   . Personal history of other diseases of digestive system   . Personal history of colonic polyps   . History of cardiovascular stress test 06/05/09    [facility]  Echo- mild AS/mod LVH. Myocardial perfusion imaging is normal. Overall left ventricular function is normal without regional wall motion abnormalities.  . Alzheimer's dementia   . Pulmonary embolism ([place])     12/2010  . Osteoporosis, unspecified   . Normal echocardiogram     12/2010, EF 55-60%   Past Surgical History  Procedure Laterality Date  . Appendectomy    . Hemorroidectomy     Family History  Problem Relation Age of Onset  . Hypertension      fam hx  . Stroke      sibling  . Other      PNA- sibling/brain tumor- sibling  . Hypertension Mother   . Dementia Mother   . Melanoma Father   . Breast cancer Neg Hx    Social History  Substance Use Topics  . Smoking status: Former Smoker    Quit date: 05/11/1978  . Smokeless tobacco: Never Used  . Alcohol Use: No   OB History    Gravida Para Term Preterm AB TAB SAB Ectopic Multiple Living   1         1     Review of Systems  Constitutional: Negative for fever and chills.  HENT: Negative for congestion and trouble swallowing.   Eyes: Negative for visual disturbance.   Respiratory: Negative for shortness of breath.   Cardiovascular: Negative for chest pain and leg swelling.  Genitourinary: Negative for dysuria.  Musculoskeletal: Negative for neck pain.  Skin: Negative for rash and wound.  Neurological: Positive for numbness and headaches. Negative for dizziness.  All other systems reviewed and are negative.     Allergies  Boniva and Raloxifene  Home Medications   Prior to Admission medications   Medication Sig Start Date End Date Taking? Authorizing Provider  Calcium Carb-Cholecalciferol 600-400 MG-UNIT TABS Take 1 tablet by mouth daily.     Historical Provider, MD  Cholecalciferol (VITAMIN D) 2000 UNITS CAPS Take 1 capsule by mouth daily.    Historical Provider, MD  docusate sodium (STOOL SOFTENER) 100 MG capsule Take 100 mg by mouth daily.      Historical Provider, MD  donepezil (ARICEPT) 10 MG tablet Take 1 tablet (10 mg total) by mouth daily. 12/31/14   Tonia Ghent, MD  Multiple Vitamin (MULTIVITAMIN WITH MINERALS) TABS Take 1 tablet by mouth daily.    Historical Provider, MD  omega-3 acid ethyl esters (LOVAZA) 1 G capsule Take 2 capsules (2 g total) by mouth 2 (two) times daily. 07/29/11   Tonia Ghent, MD  omeprazole (PRILOSEC) 20 MG capsule TAKE 1 CAPSULE DAILY AS  NEEDED FOR ACID REFLUX 12/31/14   Tonia Ghent, MD   BP 165/64 mmHg  Pulse 76  Temp(Src) 98.2 F (36.8 C) (Oral)  Resp 18  SpO2 98% Physical Exam  Constitutional: She appears well-developed and well-nourished.  HENT:  Head: Normocephalic and atraumatic.  Right Ear: External ear normal.  Left Ear: External ear normal.  Eyes: Pupils are equal, round, and reactive to light.  Neck: Normal range of motion.  Cardiovascular: Normal rate and regular rhythm.   Pulmonary/Chest: Effort normal.  Abdominal: Soft.  Musculoskeletal: She exhibits no edema or tenderness.  Neurological: She is alert. She displays normal reflexes. No cranial nerve deficit.  Skin: Skin is warm.   Psychiatric: She has a normal mood and affect.  Nursing note and vitals reviewed.   ED Course  Procedures (including critical care time) Labs Review Labs Reviewed  COMPREHENSIVE METABOLIC PANEL - Abnormal; Notable for the following:    Glucose, Bld 112 (*)    GFR calc non Af Amer 55 (*)    All other components within normal limits  URINALYSIS, ROUTINE W REFLEX MICROSCOPIC (NOT AT [facility]) - Abnormal; Notable for the following:    Leukocytes, UA TRACE (*)    All other components within normal limits  URINE MICROSCOPIC-ADD ON - Abnormal; Notable for the following:    Squamous Epithelial / LPF 0-5 (*)    Bacteria, UA RARE (*)    All other components within normal limits  ETHANOL  PROTIME-INR  URINE RAPID DRUG SCREEN, HOSP PERFORMED  APTT  CBC WITH DIFFERENTIAL/PLATELET  I-STAT TROPOININ, ED    Imaging Review Ct Head Wo Contrast  06/19/2015  CLINICAL DATA:  Initial evaluation for acute left-sided facial and leg numbness with headache. Now resolved. EXAM: CT HEAD WITHOUT CONTRAST TECHNIQUE: Contiguous axial images were obtained from the base of the skull through the vertex without intravenous contrast. COMPARISON:  Prior study from 12/08/2010. FINDINGS: Diffuse prominence of the CSF containing spaces consistent with generalized age-related cerebral atrophy. Patchy hypodensity within the periventricular and deep white matter both cerebral hemispheres most consistent with chronic small vessel ischemic disease. These changes are mildly progressed relative to 2012. No acute large vessel territory infarct. No acute intracranial hemorrhage. No mass lesion, midline shift, or mass effect. No hydrocephalus. No extra-axial fluid collection. Scalp soft tissues demonstrate no acute abnormality. No acute abnormality about the globes and orbits. Paranasal sinuses are largely clear.  No mastoid effusion. Calvarium intact. IMPRESSION: 1. No acute intracranial process. 2. Generalized age-related cerebral  atrophy with chronic microvascular ischemic disease, mildly progressed relative to 2012. Electronically Signed   By: Jeannine Boga M.D.   On: 06/19/2015 05:13   I have personally reviewed and evaluated these images and lab results as part of my medical decision-making. Patient states she exercises regularly, lives along drives daily has a PCP whom she sees regularly  MDM   Final diagnoses:  Nonintractable headache, unspecified chronicity pattern, unspecified headache type         Junius Creamer, NP 06/19/15 2007  Rolland Porter, MD 06/22/15 [phone]

## 2015-06-19 NOTE — Discharge Instructions (Signed)
Please follow with your primary care doctor in the next 2 days for a check-up. They must obtain records for further management.   Do not hesitate to return to the Emergency Department for any new, worsening or concerning symptoms.    General Headache Without Cause A headache is pain or discomfort felt around the head or neck area. There are many causes and types of headaches. In some cases, the cause may not be found.  HOME CARE  Managing Pain  Take over-the-counter and prescription medicines only as told by your doctor.  Lie down in a dark, quiet room when you have a headache.  If directed, apply ice to the head and neck area:  Put ice in a plastic bag.  Place a towel between your skin and the bag.  Leave the ice on for 20 minutes, 2-3 times per day.  Use a heating pad or hot shower to apply heat to the head and neck area as told by your doctor.  Keep lights dim if bright lights bother you or make your headaches worse. Eating and Drinking  Eat meals on a regular schedule.  Lessen how much alcohol you drink.  Lessen how much caffeine you drink, or stop drinking caffeine. General Instructions  Keep all follow-up visits as told by your doctor. This is important.  Keep a journal to find out if certain things bring on headaches. For example, write down:  What you eat and drink.  How much sleep you get.  Any change to your diet or medicines.  Relax by getting a massage or doing other relaxing activities.  Lessen stress.  Sit up straight. Do not tighten (tense) your muscles.  Do not use tobacco products. This includes cigarettes, chewing tobacco, or e-cigarettes. If you need help quitting, ask your doctor.  Exercise regularly as told by your doctor.  Get enough sleep. This often means 7-9 hours of sleep. GET HELP IF:  Your symptoms are not helped by medicine.  You have a headache that feels different than the other headaches.  You feel sick to your stomach  (nauseous) or you throw up (vomit).  You have a fever. GET HELP RIGHT AWAY IF:   Your headache becomes really bad.  You keep throwing up.  You have a stiff neck.  You have trouble seeing.  You have trouble speaking.  You have pain in the eye or ear.  Your muscles are weak or you lose muscle control.  You lose your balance or have trouble walking.  You feel like you will pass out (faint) or you pass out.  You have confusion.   This information is not intended to replace advice given to you by your health care provider. Make sure you discuss any questions you have with your health care provider.   Document Released: 02/04/2008 Document Revised: 01/16/2015 Document Reviewed: 08/20/2014 Elsevier Interactive Patient Education [employer].

## 2015-06-19 NOTE — ED Notes (Signed)
Per EMS, they were called to the pt's residence after she woke up around 1:30am w/ left sided facial and leg numbness and a headache.  She reports that she usually goes to bed around 5pm.  The symptoms of numbness had resolved by the time EMS arrived.  She had a negative stroke screen.  She was hypertensive 220/110 but 170/92 upon arrival at the hospital.  She does have a hx of blood clots. EKG was unremarkable.

## 2015-11-28 ENCOUNTER — Other Ambulatory Visit: Payer: Self-pay | Admitting: Family Medicine

## 2015-12-22 ENCOUNTER — Other Ambulatory Visit: Payer: Self-pay | Admitting: Family Medicine

## 2015-12-22 DIAGNOSIS — M810 Age-related osteoporosis without current pathological fracture: Secondary | ICD-10-CM

## 2015-12-22 DIAGNOSIS — R739 Hyperglycemia, unspecified: Secondary | ICD-10-CM

## 2015-12-22 DIAGNOSIS — E785 Hyperlipidemia, unspecified: Secondary | ICD-10-CM

## 2015-12-27 ENCOUNTER — Other Ambulatory Visit (INDEPENDENT_AMBULATORY_CARE_PROVIDER_SITE_OTHER): Payer: 59

## 2015-12-27 DIAGNOSIS — M810 Age-related osteoporosis without current pathological fracture: Secondary | ICD-10-CM

## 2015-12-27 DIAGNOSIS — R739 Hyperglycemia, unspecified: Secondary | ICD-10-CM

## 2015-12-27 DIAGNOSIS — E785 Hyperlipidemia, unspecified: Secondary | ICD-10-CM | POA: Diagnosis not present

## 2015-12-27 LAB — COMPREHENSIVE METABOLIC PANEL
ALK PHOS: 50 U/L (ref 39–117)
ALT: 17 U/L (ref 0–35)
AST: 21 U/L (ref 0–37)
Albumin: 4 g/dL (ref 3.5–5.2)
BILIRUBIN TOTAL: 0.4 mg/dL (ref 0.2–1.2)
BUN: 16 mg/dL (ref 6–23)
CALCIUM: 9.6 mg/dL (ref 8.4–10.5)
CO2: 30 mEq/L (ref 19–32)
CREATININE: 0.96 mg/dL (ref 0.40–1.20)
Chloride: 103 mEq/L (ref 96–112)
GFR: 58.7 mL/min — ABNORMAL LOW (ref >60.00)
GLUCOSE: 100 mg/dL — AB (ref 70–99)
POTASSIUM: 4.3 meq/L (ref 3.5–5.1)
Sodium: 138 mEq/L (ref 135–145)
Total Protein: 6.9 g/dL (ref 6.0–8.3)

## 2015-12-27 LAB — LIPID PANEL
CHOLESTEROL: 205 mg/dL — AB (ref 0–200)
HDL: 44.4 mg/dL (ref >39.00)
LDL Cholesterol: 137 mg/dL — ABNORMAL HIGH (ref 0–99)
NONHDL: 160.43
Total CHOL/HDL Ratio: 5
Triglycerides: 119 mg/dL (ref 0.0–149.0)
VLDL: 23.8 mg/dL (ref 0.0–40.0)

## 2015-12-27 LAB — HEMOGLOBIN A1C: HEMOGLOBIN A1C: 6.2 % (ref 4.6–6.5)

## 2015-12-27 LAB — VITAMIN D 25 HYDROXY (VIT D DEFICIENCY, FRACTURES): VITD: 42.16 ng/mL (ref 30.00–100.00)

## 2016-01-03 ENCOUNTER — Ambulatory Visit (INDEPENDENT_AMBULATORY_CARE_PROVIDER_SITE_OTHER): Payer: 59 | Admitting: Family Medicine

## 2016-01-03 ENCOUNTER — Encounter: Payer: Self-pay | Admitting: Family Medicine

## 2016-01-03 VITALS — BP 142/80 | HR 62 | Temp 97.7°F | Ht 64.0 in | Wt 182.0 lb

## 2016-01-03 DIAGNOSIS — K219 Gastro-esophageal reflux disease without esophagitis: Secondary | ICD-10-CM | POA: Diagnosis not present

## 2016-01-03 DIAGNOSIS — R413 Other amnesia: Secondary | ICD-10-CM | POA: Diagnosis not present

## 2016-01-03 DIAGNOSIS — Z7189 Other specified counseling: Secondary | ICD-10-CM

## 2016-01-03 DIAGNOSIS — Z0000 Encounter for general adult medical examination without abnormal findings: Secondary | ICD-10-CM | POA: Diagnosis not present

## 2016-01-03 DIAGNOSIS — Z23 Encounter for immunization: Secondary | ICD-10-CM

## 2016-01-03 DIAGNOSIS — K130 Diseases of lips: Secondary | ICD-10-CM | POA: Diagnosis not present

## 2016-01-03 MED ORDER — OMEPRAZOLE 20 MG PO CPDR: DELAYED_RELEASE_CAPSULE | ORAL | 3 refills | Status: DC

## 2016-01-03 NOTE — Progress Notes (Signed)
I have personally reviewed the Medicare Annual Wellness questionnaire and have noted 1. The patient's medical and social history 2. Their use of alcohol, tobacco or illicit drugs 3. Their current medications and supplements 4. The patient's functional ability including ADL's, fall risks, home safety risks and hearing or visual             impairment. 5. Diet and physical activities 6. Evidence for depression or mood disorders  The patients weight, height, BMI have been recorded in the chart and visual acuity is per eye clinic.  I have made referrals, counseling and provided education to the patient based review of the above and I have provided the pt with a written personalized care plan for preventive services.  Provider list updated- see scanned forms.  Routine anticipatory guidance given to patient.  See health maintenance.  Flu today Shingles UTD PNA UTD Tetanus 2010 Colonoscopy  Breast cancer screening pending.  DXA declined.  This is reasonable.  Advance directive-son John designated if patient were incapacitated.  Cognitive function addressed- see scanned forms- and if abnormal then additional documentation follows.   Lump near R lower lip, not ttp. Present for about 1 week.  Never drained.  Not red.  No trauma.    Memory testing wnl today.  She asked about my kids by name and recalled their ages.  No sig change in memory per patient report, from last OV.  No sig change from baseline.  Compliant with med. No ADE on med.   GERD controlled with PPI, no ADE on med.  rx sent.   PMH and SH reviewed  Meds, vitals, and allergies reviewed.   ROS: Per HPI.  Unless specifically indicated otherwise in HPI, the patient denies:  General: fever. Eyes: acute vision changes ENT: sore throat Cardiovascular: chest pain Respiratory: SOB GI: vomiting GU: dysuria Musculoskeletal: acute back pain Derm: acute rash Neuro: acute motor dysfunction Psych: worsening mood Endocrine:  polydipsia Heme: bleeding Allergy: hayfever  GEN: nad, alert and oriented HEENT: mucous membranes moist, OP wnl.  Lips wnl but small mass noted in the skin just inferior to R side of lower lip.  Not ttp, no red, not draining.   NECK: supple w/o LA CV: rrr. PULM: ctab, no inc wob ABD: soft, +bs EXT: no edema SKIN: no acute rash

## 2016-01-03 NOTE — Progress Notes (Signed)
Pre visit review using our clinic review tool, if applicable. No additional management support is needed unless otherwise documented below in the visit note. 

## 2016-01-03 NOTE — Patient Instructions (Signed)
[facility] will call about your referral. Take care.  Glad to see you.  Update me as needed.   

## 2016-01-05 DIAGNOSIS — K130 Diseases of lips: Secondary | ICD-10-CM | POA: Insufficient documentation

## 2016-01-05 NOTE — Assessment & Plan Note (Signed)
GERD controlled with PPI, no ADE on med.  

## 2016-01-05 NOTE — Assessment & Plan Note (Signed)
Advance directive-son John designated if patient were incapacitated. °

## 2016-01-05 NOTE — Assessment & Plan Note (Signed)
No sig change from baseline.  Compliant with med. No ADE on med.  Continue as is.  She agrees.

## 2016-01-05 NOTE — Assessment & Plan Note (Signed)
Flu today Shingles UTD PNA UTD Tetanus 2010 Colonoscopy  Breast cancer screening pending.  DXA declined.  This is reasonable.  Advance directive-son John designated if patient were incapacitated.  Cognitive function addressed- see scanned forms- and if abnormal then additional documentation follows.

## 2016-01-05 NOTE — Assessment & Plan Note (Signed)
This could be a benign lipoma.  D/w pt.  Would have her see ENT, no other intervention in the meantime.  Doesn't look infected.  She agrees.

## 2016-01-14 DIAGNOSIS — Z961 Presence of intraocular lens: Secondary | ICD-10-CM | POA: Diagnosis not present

## 2016-01-14 DIAGNOSIS — H401132 Primary open-angle glaucoma, bilateral, moderate stage: Secondary | ICD-10-CM | POA: Diagnosis not present

## 2016-01-21 ENCOUNTER — Other Ambulatory Visit: Payer: Self-pay | Admitting: Unknown Physician Specialty

## 2016-01-21 DIAGNOSIS — D3701 Neoplasm of uncertain behavior of lip: Secondary | ICD-10-CM

## 2016-01-21 DIAGNOSIS — M278 Other specified diseases of jaws: Secondary | ICD-10-CM

## 2016-01-21 DIAGNOSIS — R220 Localized swelling, mass and lump, head: Principal | ICD-10-CM

## 2016-01-23 ENCOUNTER — Ambulatory Visit
Admission: RE | Admit: 2016-01-23 | Discharge: 2016-01-23 | Disposition: A | Discharge status: Home / Self Care | Payer: Medicare Other | Source: Ambulatory Visit | Attending: Unknown Physician Specialty | Admitting: Unknown Physician Specialty

## 2016-01-23 DIAGNOSIS — M278 Other specified diseases of jaws: Secondary | ICD-10-CM

## 2016-01-23 DIAGNOSIS — D3701 Neoplasm of uncertain behavior of lip: Secondary | ICD-10-CM

## 2016-01-23 DIAGNOSIS — R220 Localized swelling, mass and lump, head: Secondary | ICD-10-CM | POA: Diagnosis present

## 2016-01-23 MED ORDER — IOPAMIDOL (ISOVUE-300) INJECTION 61%
75.0000 mL | Freq: Once | INTRAVENOUS | Status: AC | PRN
  Administered 2016-01-23: 75 mL via INTRAVENOUS

## 2016-01-27 ENCOUNTER — Encounter: Payer: Self-pay | Admitting: *Deleted

## 2016-01-28 NOTE — Discharge Instructions (Signed)

## 2016-01-31 ENCOUNTER — Encounter
Admission: RE | Disposition: A | Discharge status: Home / Self Care | Payer: Self-pay | Source: Ambulatory Visit | Attending: Unknown Physician Specialty

## 2016-01-31 ENCOUNTER — Ambulatory Visit: Payer: Medicare Other | Admitting: Anesthesiology

## 2016-01-31 ENCOUNTER — Ambulatory Visit
Admission: RE | Admit: 2016-01-31 | Discharge: 2016-01-31 | Disposition: A | Discharge status: Home / Self Care | Payer: Medicare Other | Source: Ambulatory Visit | Attending: Unknown Physician Specialty | Admitting: Unknown Physician Specialty

## 2016-01-31 DIAGNOSIS — R413 Other amnesia: Secondary | ICD-10-CM | POA: Diagnosis not present

## 2016-01-31 DIAGNOSIS — R220 Localized swelling, mass and lump, head: Secondary | ICD-10-CM | POA: Diagnosis present

## 2016-01-31 DIAGNOSIS — K130 Diseases of lips: Secondary | ICD-10-CM | POA: Insufficient documentation

## 2016-01-31 DIAGNOSIS — I252 Old myocardial infarction: Secondary | ICD-10-CM | POA: Insufficient documentation

## 2016-01-31 DIAGNOSIS — Z79899 Other long term (current) drug therapy: Secondary | ICD-10-CM | POA: Insufficient documentation

## 2016-01-31 DIAGNOSIS — K219 Gastro-esophageal reflux disease without esophagitis: Secondary | ICD-10-CM | POA: Insufficient documentation

## 2016-01-31 DIAGNOSIS — Z87891 Personal history of nicotine dependence: Secondary | ICD-10-CM | POA: Diagnosis not present

## 2016-01-31 HISTORY — PX: MASS EXCISION: SHX2000

## 2016-01-31 HISTORY — DX: Presence of dental prosthetic device (complete) (partial): Z97.2

## 2016-01-31 HISTORY — DX: Gastro-esophageal reflux disease without esophagitis: K21.9

## 2016-01-31 SURGERY — EXCISION MASS
Anesthesia: Monitor Anesthesia Care | Laterality: Right | Wound class: Clean

## 2016-01-31 MED ORDER — LACTATED RINGERS IV SOLN
INTRAVENOUS | Status: DC
  Administered 2016-01-31: 09:00:00 via INTRAVENOUS

## 2016-01-31 MED ORDER — MIDAZOLAM HCL 2 MG/2ML IJ SOLN
INTRAMUSCULAR | Status: DC | PRN
  Administered 2016-01-31 (×2): 1 mg via INTRAVENOUS

## 2016-01-31 MED ORDER — LIDOCAINE HCL (PF) 1 % IJ SOLN
INTRAMUSCULAR | Status: DC | PRN
  Administered 2016-01-31: 3 mL

## 2016-01-31 MED ORDER — FENTANYL CITRATE (PF) 100 MCG/2ML IJ SOLN
INTRAMUSCULAR | Status: DC | PRN
  Administered 2016-01-31: 50 ug via INTRAVENOUS

## 2016-01-31 MED ORDER — LIDOCAINE HCL (CARDIAC) 20 MG/ML IV SOLN
INTRAVENOUS | Status: DC | PRN
  Administered 2016-01-31: 50 mg via INTRAVENOUS

## 2016-01-31 MED ORDER — PROPOFOL 500 MG/50ML IV EMUL
INTRAVENOUS | Status: DC | PRN
  Administered 2016-01-31: 25 ug/kg/min via INTRAVENOUS

## 2016-01-31 SURGICAL SUPPLY — 29 items
APPLICATOR COTTON TIP WD 3 STR (MISCELLANEOUS) IMPLANT
BLADE SURG 15 STRL LF DISP TIS (BLADE) IMPLANT
BLADE SURG 15 STRL SS (BLADE)
CANISTER SUCT 1200ML W/VALVE (MISCELLANEOUS) IMPLANT
CORD BIP STRL DISP 12FT (MISCELLANEOUS) ×3 IMPLANT
DRAPE HEAD BAR (DRAPES) ×3 IMPLANT
DRESSING TELFA 4X3 1S ST N-ADH (GAUZE/BANDAGES/DRESSINGS) IMPLANT
ELECT CAUTERY BLADE TIP 2.5 (TIP)
ELECT CAUTERY NDL 2.0 MIC (NEEDLE) IMPLANT
ELECT CAUTERY NEEDLE 2.0 MIC (NEEDLE) IMPLANT
ELECTRODE CAUTERY BLDE TIP 2.5 (TIP) IMPLANT
GAUZE SPONGE 4X4 12PLY STRL (GAUZE/BANDAGES/DRESSINGS) IMPLANT
GLOVE BIO SURGEON STRL SZ7.5 (GLOVE) ×6 IMPLANT
KIT ROOM TURNOVER OR (KITS) ×3 IMPLANT
LIQUID BAND (GAUZE/BANDAGES/DRESSINGS) IMPLANT
NEEDLE HYPO 25GX1X1/2 BEV (NEEDLE) ×3 IMPLANT
NS IRRIG 500ML POUR BTL (IV SOLUTION) ×3 IMPLANT
PACK DRAPE NASAL/ENT (PACKS) ×3 IMPLANT
PAD GROUND ADULT SPLIT (MISCELLANEOUS) ×3 IMPLANT
PENCIL ELECTRO HAND CTR (MISCELLANEOUS) ×1 IMPLANT
SOL PREP PVP 2OZ (MISCELLANEOUS) ×3
SOLUTION PREP PVP 2OZ (MISCELLANEOUS) ×1 IMPLANT
STRAP BODY AND KNEE 60X3 (MISCELLANEOUS) ×3 IMPLANT
SUCTION FRAZIER TIP 10 FR DISP (SUCTIONS) IMPLANT
SUT PROLENE 5 0 P 3 (SUTURE) ×2 IMPLANT
SUT VIC AB 4-0 RB1 27 (SUTURE) ×3
SUT VIC AB 4-0 RB1 27X BRD (SUTURE) IMPLANT
SYRINGE 10CC LL (SYRINGE) ×3 IMPLANT
TOWEL OR 17X26 4PK STRL BLUE (TOWEL DISPOSABLE) ×3 IMPLANT

## 2016-01-31 NOTE — H&P (Signed)
  H+P  Reviewed and will be scanned in later. No changes noted. 

## 2016-01-31 NOTE — Anesthesia Postprocedure Evaluation (Signed)
Anesthesia Post Note  Patient: Katrina Armstrong  Procedure(s) Performed: Procedure(s) (LRB): EXCISION MASS RIGHT LOWER LIP (Right)  Patient location during evaluation: PACU Anesthesia Type: MAC Level of consciousness: awake and alert Pain management: pain level controlled Vital Signs Assessment: post-procedure vital signs reviewed and stable Respiratory status: spontaneous breathing, nonlabored ventilation, respiratory function stable and patient connected to nasal cannula oxygen Cardiovascular status: stable and blood pressure returned to baseline Anesthetic complications: no Comments: Pt to f/u with PCP about BP control.    Fidel Levy

## 2016-01-31 NOTE — Anesthesia Procedure Notes (Signed)
Procedure Name: MAC Performed by: Fortune Brannigan Pre-anesthesia Checklist: Patient identified, Emergency Drugs available, Suction available, Timeout performed and Patient being monitored Patient Re-evaluated:Patient Re-evaluated prior to inductionOxygen Delivery Method: Nasal cannula Placement Confirmation: positive ETCO2     

## 2016-01-31 NOTE — Anesthesia Preprocedure Evaluation (Signed)
Anesthesia Evaluation  Patient identified by MRN, date of birth, ID band  Reviewed: NPO status   History of Anesthesia Complications Negative for: history of anesthetic complications  Airway Mallampati: II  TM Distance: >3 FB Neck ROM: full    Dental  (+) Upper Dentures, Lower Dentures   Pulmonary neg pulmonary ROS, former smoker,  PE : 2012    Pulmonary exam normal        Cardiovascular Exercise Tolerance: Good + Past MI (nstemi 2012, but pt denies.)  Normal cardiovascular exam     Neuro/Psych Memory loss; negative psych ROS   GI/Hepatic Neg liver ROS, GERD  Controlled,  Endo/Other  negative endocrine ROS  Renal/GU negative Renal ROS  negative genitourinary   Musculoskeletal   Abdominal   Peds  Hematology negative hematology ROS (+)   Anesthesia Other Findings   Reproductive/Obstetrics                             Anesthesia Physical Anesthesia Plan  ASA: III  Anesthesia Plan: MAC   Post-op Pain Management:    Induction:   Airway Management Planned:   Additional Equipment:   Intra-op Plan:   Post-operative Plan:   Informed Consent: I have reviewed the patients History and Physical, chart, labs and discussed the procedure including the risks, benefits and alternatives for the proposed anesthesia with the patient or authorized representative who has indicated his/her understanding and acceptance.     Plan Discussed with: CRNA  Anesthesia Plan Comments: (Backup GA.)        Anesthesia Quick Evaluation

## 2016-01-31 NOTE — Op Note (Signed)
01/31/2016  11:28 AM    Katrina Armstrong  [id number]   Pre-Op Dx: RIGHT LOWER LIP MASS  Post-op Dx: SAME  Proc: Excision of right lower lip subcutaneous mass measuring approximately 1.5 x 2 cm   Surg:  Beverly Gust Armstrong  Anes:  GOT  EBL:  Less than 5 cc  Comp:  None  Findings:  Firm 1.5 x 2 cm subcutaneous mass right lower lip  Procedure: Katrina Armstrong was identified in the holding area taken the operating room placed in supine position. After IV sedation incision line was marked just beneath the right lower lip overlying the chin. A firm mass was palpated there. A local anesthetic of 1% lidocaine with 1 100,000 units of epinephrine was used to inject overlying the mass a total of 3 cc was used.  With the right chin prepped and draped sterilely a 15 blade was used to make an incision along the skin crease line. Dissection proceeded in the subcutaneous tissues were immediately a firm mass was identified. Using the short sharp scissors and the microbipolar the subcutaneous taste mass was dissected free from the underlying tissue removing in its entirety. With the mass removed the wound was copious irrigated with saline any small bleeding vessels were cauterized using the microbipolar. The subcutaneous taste tissues were closed using 5-0 Vicryl and skin was closed using a 5-0 Prolene. Patient was then awakened in the operating room taken recovery room stable condition.  Cultures: None  Specimens: Right lower lip mass  Dispo:   Good  Plan:  Discharge to home follow-up 10 days  Katrina Armstrong  01/31/2016 11:28 AM

## 2016-01-31 NOTE — Transfer of Care (Signed)
Immediate Anesthesia Transfer of Care Note  Patient: Katrina Armstrong  Procedure(s) Performed: Procedure(s): EXCISION MASS RIGHT LOWER LIP (Right)  Patient Location: PACU  Anesthesia Type: MAC  Level of Consciousness: awake, alert  and patient cooperative  Airway and Oxygen Therapy: Patient Spontanous Breathing and Patient connected to supplemental oxygen  Post-op Assessment: Post-op Vital signs reviewed, Patient's Cardiovascular Status Stable, Respiratory Function Stable, Patent Airway and No signs of Nausea or vomiting  Post-op Vital Signs: Reviewed and stable  Complications: No apparent anesthesia complications

## 2016-02-03 ENCOUNTER — Encounter: Payer: Self-pay | Admitting: Unknown Physician Specialty

## 2016-02-06 LAB — SURGICAL PATHOLOGY

## 2016-05-15 DIAGNOSIS — T148XXA Other injury of unspecified body region, initial encounter: Secondary | ICD-10-CM | POA: Diagnosis not present

## 2016-10-01 ENCOUNTER — Encounter: Payer: Self-pay | Admitting: Family Medicine

## 2016-10-01 ENCOUNTER — Ambulatory Visit (INDEPENDENT_AMBULATORY_CARE_PROVIDER_SITE_OTHER): Payer: Medicare Other | Admitting: Family Medicine

## 2016-10-01 VITALS — BP 132/72 | HR 79 | Temp 98.0°F | Wt 180.5 lb

## 2016-10-01 DIAGNOSIS — R5383 Other fatigue: Secondary | ICD-10-CM

## 2016-10-01 DIAGNOSIS — E785 Hyperlipidemia, unspecified: Secondary | ICD-10-CM

## 2016-10-01 DIAGNOSIS — M810 Age-related osteoporosis without current pathological fracture: Secondary | ICD-10-CM

## 2016-10-01 DIAGNOSIS — R739 Hyperglycemia, unspecified: Secondary | ICD-10-CM

## 2016-10-01 DIAGNOSIS — W57XXXA Bitten or stung by nonvenomous insect and other nonvenomous arthropods, initial encounter: Secondary | ICD-10-CM

## 2016-10-01 LAB — CBC WITH DIFFERENTIAL/PLATELET
BASOS PCT: 0.9 % (ref 0.0–3.0)
Basophils Absolute: 0.1 10*3/uL (ref 0.0–0.1)
EOS PCT: 1.8 % (ref 0.0–5.0)
Eosinophils Absolute: 0.2 10*3/uL (ref 0.0–0.7)
HEMATOCRIT: 38.2 % (ref 36.0–46.0)
HEMOGLOBIN: 12.5 g/dL (ref 12.0–15.0)
Lymphocytes Relative: 42.1 % (ref 12.0–46.0)
Lymphs Abs: 4.5 10*3/uL — ABNORMAL HIGH (ref 0.7–4.0)
MCHC: 32.7 g/dL (ref 30.0–36.0)
MCV: 86.7 fl (ref 78.0–100.0)
MONOS PCT: 7.1 % (ref 3.0–12.0)
Monocytes Absolute: 0.8 10*3/uL (ref 0.1–1.0)
Neutro Abs: 5.2 10*3/uL (ref 1.4–7.7)
Neutrophils Relative %: 48.1 % (ref 43.0–77.0)
Platelets: 220 10*3/uL (ref 150.0–400.0)
RBC: 4.41 Mil/uL (ref 3.87–5.11)
RDW: 14.8 % (ref 11.5–15.5)
WBC: 10.7 10*3/uL — AB (ref 4.0–10.5)

## 2016-10-01 LAB — COMPREHENSIVE METABOLIC PANEL
ALBUMIN: 3.9 g/dL (ref 3.5–5.2)
ALK PHOS: 52 U/L (ref 39–117)
ALT: 17 U/L (ref 0–35)
AST: 24 U/L (ref 0–37)
BUN: 19 mg/dL (ref 6–23)
CHLORIDE: 102 meq/L (ref 96–112)
CO2: 30 mEq/L (ref 19–32)
Calcium: 9.6 mg/dL (ref 8.4–10.5)
Creatinine, Ser: 1.17 mg/dL (ref 0.40–1.20)
GFR: 46.63 mL/min — AB (ref >60.00)
Glucose, Bld: 110 mg/dL — ABNORMAL HIGH (ref 70–99)
POTASSIUM: 3.8 meq/L (ref 3.5–5.1)
SODIUM: 136 meq/L (ref 135–145)
TOTAL PROTEIN: 6.7 g/dL (ref 6.0–8.3)
Total Bilirubin: 0.3 mg/dL (ref 0.2–1.2)

## 2016-10-01 LAB — LIPID PANEL
CHOL/HDL RATIO: 5
Cholesterol: 206 mg/dL — ABNORMAL HIGH (ref 0–200)
HDL: 40.1 mg/dL (ref >39.00)
LDL CALC: 133 mg/dL — AB (ref 0–99)
NONHDL: 165.99
Triglycerides: 165 mg/dL — ABNORMAL HIGH (ref 0.0–149.0)
VLDL: 33 mg/dL (ref 0.0–40.0)

## 2016-10-01 LAB — HEMOGLOBIN A1C: Hgb A1c MFr Bld: 6.2 % (ref 4.6–6.5)

## 2016-10-01 LAB — TSH: TSH: 4.32 u[IU]/mL (ref 0.35–4.50)

## 2016-10-01 LAB — VITAMIN D 25 HYDROXY (VIT D DEFICIENCY, FRACTURES): VITD: 54.34 ng/mL (ref 30.00–100.00)

## 2016-10-01 NOTE — Assessment & Plan Note (Signed)
Unclear source, she feels some better today. Reasonable to check routine labs along with tick labs. See orders. No active cellulitis. She only has some mild residual inflammation at the bite site. No antibiotics started at this point. Okay for outpatient follow-up. She is clearly nontoxic. Discussed with patient about tick associated illnesses and fatigue workup in general.  >25 minutes spent in face to face time with patient, >50% spent in counselling or coordination of care. We talked about getting her routine labs drawn today. See notes on labs.

## 2016-10-01 NOTE — Progress Notes (Signed)
She asked about my kids today by name, as per her routine.  I thanked her.    "Until about 2 weeks ago I felt great."  She had been exercising at the gym mult days per week.  About 2 weeks ago, she was working in the yard.  Since then she hasn't had much energy,  Until today when she feels a little better. About the time her symptoms started, she had a tick bite on R medial forearm.  The tick wasn't engorged.  The area is itchy.  No fevers, no vomiting, no diarrhea.  No other rash.    She also hit a stump on her lawnmower (she normally clears it), the blade was set lower than normal and that jolted her and she bruised her L breast on the steering wheel.  The bruise is nearly resolved and it isn't tender now.  We discussed.   She was worried about developing a DVT from the bruising in the skin but this should not be an issue.  We discussed getting routine labs at today's visit. She is not fasting, but that is okay for now.   Meds, vitals, and allergies reviewed.   ROS: Per HPI unless specifically indicated in ROS section   GEN: nad, alert and oriented HEENT: mucous membranes moist NECK: supple w/o LA CV: rrr.   PULM: ctab, no inc wob ABD: soft, +bs EXT: trace BLE edema SKIN: no acute rash  Except for minimal erythema that is barely visible at the previous tick bite site on the right medial forearm. There is no spreading erythema o/w. No fluctuant mass. No retained parts seen.

## 2016-10-01 NOTE — Patient Instructions (Signed)
Don't change your meds for now.  Go to the lab on the way out.  We'll contact you with your lab report. We'll be in touch.  Take care.  Glad to see you.  

## 2016-10-02 LAB — LYME AB/WESTERN BLOT REFLEX: B burgdorferi Ab IgG+IgM: <0.9 Index (ref <0.90)

## 2016-10-02 LAB — ROCKY MTN SPOTTED FVR ABS PNL(IGG+IGM)
RMSF IGG: NOT DETECTED
RMSF IGM: NOT DETECTED

## 2016-11-02 ENCOUNTER — Telehealth: Payer: Self-pay

## 2016-11-02 MED ORDER — VALACYCLOVIR HCL 1 G PO TABS: 1000.0000 mg | ORAL_TABLET | Freq: Three times a day (TID) | ORAL | 0 refills | Status: DC

## 2016-11-02 NOTE — Telephone Encounter (Signed)
Sent.  Update [place] if not better, if pain gets worse.  Thanks.

## 2016-11-02 NOTE — Telephone Encounter (Signed)
Pt thinks has flare up of shingles; has blister like rash on neck on lt side and behind ear. Rash started 10/29/16. pt request rx for shingles sent to [facility]. Pt said she had shingles 3-4 yrs ago in same spot. Please advise.

## 2016-11-02 NOTE — Telephone Encounter (Signed)
Left detailed message on voicemail.  

## 2017-01-04 ENCOUNTER — Other Ambulatory Visit: Payer: Self-pay | Admitting: Family Medicine

## 2017-01-04 DIAGNOSIS — R739 Hyperglycemia, unspecified: Secondary | ICD-10-CM

## 2017-01-05 ENCOUNTER — Ambulatory Visit (INDEPENDENT_AMBULATORY_CARE_PROVIDER_SITE_OTHER): Payer: Medicare Other

## 2017-01-05 VITALS — BP 118/80 | HR 60 | Temp 97.8°F | Ht 62.5 in | Wt 179.5 lb

## 2017-01-05 DIAGNOSIS — R739 Hyperglycemia, unspecified: Secondary | ICD-10-CM | POA: Diagnosis not present

## 2017-01-05 DIAGNOSIS — Z0000 Encounter for general adult medical examination without abnormal findings: Secondary | ICD-10-CM | POA: Diagnosis not present

## 2017-01-05 LAB — LIPID PANEL
CHOL/HDL RATIO: 5
CHOLESTEROL: 208 mg/dL — AB (ref 0–200)
HDL: 38.2 mg/dL — AB (ref >39.00)
LDL Cholesterol: 136 mg/dL — ABNORMAL HIGH (ref 0–99)
NONHDL: 169.66
TRIGLYCERIDES: 169 mg/dL — AB (ref 0.0–149.0)
VLDL: 33.8 mg/dL (ref 0.0–40.0)

## 2017-01-05 LAB — BASIC METABOLIC PANEL
BUN: 16 mg/dL (ref 6–23)
CALCIUM: 9.9 mg/dL (ref 8.4–10.5)
CO2: 32 mEq/L (ref 19–32)
CREATININE: 1.04 mg/dL (ref 0.40–1.20)
Chloride: 101 mEq/L (ref 96–112)
GFR: 53.39 mL/min — AB (ref >60.00)
Glucose, Bld: 96 mg/dL (ref 70–99)
Potassium: 4.2 mEq/L (ref 3.5–5.1)
SODIUM: 138 meq/L (ref 135–145)

## 2017-01-05 LAB — HEMOGLOBIN A1C: HEMOGLOBIN A1C: 6.1 % (ref 4.6–6.5)

## 2017-01-05 NOTE — Progress Notes (Signed)
Subjective:   Katrina Armstrong is a 83 y.o. female who presents for Medicare Annual (Subsequent) preventive examination.  Review of Systems:  N/A Cardiac Risk Factors include: advanced age (>93men, >22 women);dyslipidemia;obesity (BMI >30kg/m2)     Objective:     Vitals: BP 118/80 (BP Location: Right Arm, Patient Position: Sitting, Cuff Size: Normal)   Pulse 60   Temp 97.8 F (36.6 C) (Oral)   Ht 5' 2.5" (1.588 m) Comment: no shoes  Wt 179 lb 8 oz (81.4 kg)   SpO2 95%   BMI 32.31 kg/m   Body mass index is 32.31 kg/m.   Tobacco History  Smoking Status  . Former Smoker  . Quit date: 05/11/1978  Smokeless Tobacco  . Never Used     Counseling given: No   Past Medical History:  Diagnosis Date  . Alzheimer's dementia   . GERD (gastroesophageal reflux disease)   . History of cardiovascular stress test 06/05/09   [facility]  Echo- mild AS/mod LVH. Myocardial perfusion imaging is normal. Overall left ventricular function is normal without regional wall motion abnormalities.  . Hyperlipidemia   . Memory loss   . Normal echocardiogram    12/2010, EF 55-60%  . Osteoporosis, unspecified   . Personal history of colonic polyps   . Personal history of other diseases of digestive system   . Pulmonary embolism ([place])    12/2010  . Wears dentures    full upper and lower   Past Surgical History:  Procedure Laterality Date  . APPENDECTOMY    . HEMORROIDECTOMY    . MASS EXCISION Right 01/31/2016   Procedure: EXCISION MASS RIGHT LOWER LIP;  Surgeon: Beverly Gust, MD;  Location: [facility];  Service: ENT;  Laterality: Right;   Family History  Problem Relation Age of Onset  . Hypertension Mother   . Dementia Mother   . Melanoma Father   . Hypertension Unknown        fam hx  . Stroke Unknown        sibling  . Other Unknown        PNA- sibling/brain tumor- sibling  . Cancer Sister        brain tumor  . Breast cancer Neg Hx   . Colon cancer Neg Hx    History    Sexual Activity  . Sexual activity: Not on file    Outpatient Encounter Prescriptions as of 01/05/2017  Medication Sig  . Calcium Carb-Cholecalciferol 600-400 MG-UNIT TABS Take 1 tablet by mouth daily.   . Cholecalciferol (VITAMIN D) 2000 UNITS CAPS Take 1 capsule by mouth daily.  Marland Kitchen docusate sodium (STOOL SOFTENER) 100 MG capsule Take 100 mg by mouth as needed.   . donepezil (ARICEPT) 10 MG tablet TAKE 1 TABLET DAILY  . Multiple Vitamin (MULTIVITAMIN WITH MINERALS) TABS Take 1 tablet by mouth daily.  Marland Kitchen omeprazole (PRILOSEC) 20 MG capsule TAKE 1 CAPSULE DAILY AS    NEEDED FOR ACID REFLUX  . [DISCONTINUED] valACYclovir (VALTREX) 1000 MG tablet Take 1 tablet (1,000 mg total) by mouth 3 (three) times daily.   No facility-administered encounter medications on file as of 01/05/2017.     Activities of Daily Living In your present state of health, do you have any difficulty performing the following activities: 01/05/2017 01/31/2016  Hearing? N N  Vision? N N  Difficulty concentrating or making decisions? Y N  Walking or climbing stairs? N N  Dressing or bathing? N N  Doing errands, shopping? N -  Preparing Food and eating ? N -  Using the Toilet? N -  In the past six months, have you accidently leaked urine? N -  Do you have problems with loss of bowel control? N -  Managing your Medications? N -  Managing your Finances? N -  Housekeeping or managing your Housekeeping? N -  Some recent data might be hidden    Patient Care Team: Tonia Ghent, MD as PCP - General    Assessment:     Hearing Screening   125Hz  250Hz  500Hz  1000Hz  2000Hz  3000Hz  4000Hz  6000Hz  8000Hz   Right ear:   40 0 0  0    Left ear:   0 40 0  0    Vision Screening Comments: Last vision exam in Spring 2018   Exercise Activities and Dietary recommendations Current Exercise Habits: Home exercise routine, Type of exercise: strength training/weights;treadmill, Time (Minutes): 50, Frequency (Times/Week): 5, Weekly  Exercise (Minutes/Week): 250, Intensity: Moderate, Exercise limited by: None identified  Goals    . Increase physical activity          Starting 01/05/2017, I will continue to exercise for 45-60 min 5 days per week.       Fall Risk Fall Risk  01/05/2017 01/03/2016 12/31/2014 12/18/2013  Falls in the past year? No No No No   Depression Screen PHQ 2/9 Scores 01/05/2017 01/03/2016 12/31/2014 12/18/2013  PHQ - 2 Score 0 0 0 0  PHQ- 9 Score 0 - - -     Cognitive Function MMSE - Mini Mental State Exam 01/05/2017  Orientation to time 5  Orientation to Place 5  Registration 3  Attention/ Calculation 0  Recall 3  Language- name 2 objects 0  Language- repeat 1  Language- follow 3 step command 3  Language- read & follow direction 0  Write a sentence 0  Copy design 0  Total score 20       PLEASE NOTE: A Mini-Cog screen was completed. Maximum score is 20. A value of 0 denotes this part of Folstein MMSE was not completed or the patient failed this part of the Mini-Cog screening.   Mini-Cog Screening Orientation to Time - Max 5 pts Orientation to Place - Max 5 pts Registration - Max 3 pts Recall - Max 3 pts Language Repeat - Max 1 pts Language Follow 3 Step Command - Max 3 pts   Immunization History  Administered Date(s) Administered  . Influenza,inj,Quad PF,6+ Mos 01/03/2016  . Influenza-Unspecified 02/19/2014  . Pneumococcal Conjugate-13 12/18/2013  . Pneumococcal Polysaccharide-23 05/11/2005, 12/09/2010  . Td 09/07/2008   Screening Tests Health Maintenance  Topic Date Due  . INFLUENZA VACCINE  08/08/2017 (Originally 12/09/2016)  . DEXA SCAN  01/03/2019 (Originally 12/04/1995)  . TETANUS/TDAP  09/08/2018  . PNA vac Low Risk Adult  Completed      Plan:     I have personally reviewed and addressed the Medicare Annual Wellness questionnaire and have noted the following in the patient's chart:  A. Medical and social history B. Use of alcohol, tobacco or illicit drugs  C. Current  medications and supplements D. Functional ability and status E.  Nutritional status F.  Physical activity G. Advance directives H. List of other physicians I.  Hospitalizations, surgeries, and ER visits in previous 12 months J.  [place] to include hearing, vision, cognitive, depression L. Referrals and appointments - none  In addition, I have reviewed and discussed with patient certain preventive protocols, quality metrics, and best practice recommendations.  A written personalized care plan for preventive services as well as general preventive health recommendations were provided to patient.  See attached scanned questionnaire for additional information.   Signed,   Lindell Noe, MHA, BS, LPN Health Coach

## 2017-01-05 NOTE — Progress Notes (Signed)
PCP notes:   Health maintenance:  Flu vaccine - addressed  Abnormal screenings:   Hearing - failed  Patient concerns:   Patient reports concern with fungus on left big toe.  Nurse concerns:  None  Next PCP appt:   01/12/17 @ 0845  I reviewed health advisor's note, was available for consultation on the day of service listed in this note, and agree with documentation and plan. Elsie Stain, MD.

## 2017-01-05 NOTE — Progress Notes (Signed)
Pre visit review using our clinic review tool, if applicable. No additional management support is needed unless otherwise documented below in the visit note. 

## 2017-01-05 NOTE — Patient Instructions (Signed)
Katrina Armstrong , Thank you for taking time to come for your Medicare Wellness Visit. I appreciate your ongoing commitment to your health goals. Please review the following plan we discussed and let me know if I can assist you in the future.   These are the goals we discussed: Goals    . Increase physical activity          Starting 01/05/2017, I will continue to exercise for 45-60 min 5 days per week.        This is a list of the screening recommended for you and due dates:  Health Maintenance  Topic Date Due  . Flu Shot  08/08/2017*  . DEXA scan (bone density measurement)  01/03/2019*  . Tetanus Vaccine  09/08/2018  . Pneumonia vaccines  Completed  *Topic was postponed. The date shown is not the original due date.   Preventive Care for Adults  A healthy lifestyle and preventive care can promote health and wellness. Preventive health guidelines for adults include the following key practices.  . A routine yearly physical is a good way to check with your health care provider about your health and preventive screening. It is a chance to share any concerns and updates on your health and to receive a thorough exam.  . Visit your dentist for a routine exam and preventive care every 6 months. Brush your teeth twice a day and floss once a day. Good oral hygiene prevents tooth decay and gum disease.  . The frequency of eye exams is based on your age, health, family medical history, use  of contact lenses, and other factors. Follow your health care provider's ecommendations for frequency of eye exams.  . Eat a healthy diet. Foods like vegetables, fruits, whole grains, low-fat dairy products, and lean protein foods contain the nutrients you need without too many calories. Decrease your intake of foods high in solid fats, added sugars, and salt. Eat the right amount of calories for you. Get information about a proper diet from your health care provider, if necessary.  . Regular physical exercise is  one of the most important things you can do for your health. Most adults should get at least 150 minutes of moderate-intensity exercise (any activity that increases your heart rate and causes you to sweat) each week. In addition, most adults need muscle-strengthening exercises on 2 or more days a week.  Silver Sneakers may be a benefit available to you. To determine eligibility, you may visit the website: www.silversneakers.com or contact program at 3127312369 Mon-Fri between 8AM-8PM.   . Maintain a healthy weight. The body mass index (BMI) is a screening tool to identify possible weight problems. It provides an estimate of body fat based on height and weight. Your health care provider can find your BMI and can help you achieve or maintain a healthy weight.   For adults 20 years and older: ? A BMI below 18.5 is considered underweight. ? A BMI of 18.5 to 24.9 is normal. ? A BMI of 25 to 29.9 is considered overweight. ? A BMI of 30 and above is considered obese.   . Maintain normal blood lipids and cholesterol levels by exercising and minimizing your intake of saturated fat. Eat a balanced diet with plenty of fruit and vegetables. Blood tests for lipids and cholesterol should begin at age 95 and be repeated every 5 years. If your lipid or cholesterol levels are high, you are over 50, or you are at high risk for heart disease, you  may need your cholesterol levels checked more frequently. Ongoing high lipid and cholesterol levels should be treated with medicines if diet and exercise are not working.  . If you smoke, find out from your health care provider how to quit. If you do not use tobacco, please do not start.  . If you choose to drink alcohol, please do not consume more than 2 drinks per day. One drink is considered to be 12 ounces (355 mL) of beer, 5 ounces (148 mL) of wine, or 1.5 ounces (44 mL) of liquor.  . If you are 14-56 years old, ask your health care provider if you should take  aspirin to prevent strokes.  . Use sunscreen. Apply sunscreen liberally and repeatedly throughout the day. You should seek shade when your shadow is shorter than you. Protect yourself by wearing long sleeves, pants, a wide-brimmed hat, and sunglasses year round, whenever you are outdoors.  . Once a month, do a whole body skin exam, using a mirror to look at the skin on your back. Tell your health care provider of new moles, moles that have irregular borders, moles that are larger than a pencil eraser, or moles that have changed in shape or color.

## 2017-01-12 ENCOUNTER — Encounter: Payer: Self-pay | Admitting: Family Medicine

## 2017-01-12 ENCOUNTER — Ambulatory Visit (INDEPENDENT_AMBULATORY_CARE_PROVIDER_SITE_OTHER): Payer: Medicare Other | Admitting: Family Medicine

## 2017-01-12 VITALS — BP 136/80 | HR 66 | Temp 98.7°F | Wt 181.5 lb

## 2017-01-12 DIAGNOSIS — Z23 Encounter for immunization: Secondary | ICD-10-CM | POA: Diagnosis not present

## 2017-01-12 DIAGNOSIS — Z0000 Encounter for general adult medical examination without abnormal findings: Secondary | ICD-10-CM

## 2017-01-12 DIAGNOSIS — R413 Other amnesia: Secondary | ICD-10-CM | POA: Diagnosis not present

## 2017-01-12 DIAGNOSIS — K219 Gastro-esophageal reflux disease without esophagitis: Secondary | ICD-10-CM | POA: Diagnosis not present

## 2017-01-12 DIAGNOSIS — Z7189 Other specified counseling: Secondary | ICD-10-CM

## 2017-01-12 DIAGNOSIS — M810 Age-related osteoporosis without current pathological fracture: Secondary | ICD-10-CM

## 2017-01-12 MED ORDER — DONEPEZIL HCL 10 MG PO TABS: 10.0000 mg | ORAL_TABLET | Freq: Every day | ORAL | 3 refills | Status: DC

## 2017-01-12 MED ORDER — OMEPRAZOLE 20 MG PO CPDR: DELAYED_RELEASE_CAPSULE | ORAL | 3 refills | Status: DC

## 2017-01-12 NOTE — Patient Instructions (Addendum)
Check with your insurance to see if they will cover the shingles shot. Take care.  Glad to see you.  Update me as needed.

## 2017-01-12 NOTE — Progress Notes (Signed)
Flu vaccine - addressed Mammogram due next month. D/w pt.   Hearing - failed. Declined hearing aids.   Lipids are reasonable.  D/w pt.   Not diabetic by A1c, d/w pt.   Advance directive-son John designated if patient were incapacitated.   Patient reports concern with fungus on left big toe. See exam.   Memory concerns.  Scored 20/20 on recent testing.  She has noted some slight changes over the years but stable compared to recent levels.  Compliant with meds.  She has had troubles recalling people's names, more in later life.  No red flag events.  She was able to cope with her current situation.  No ADE on med.  Her mother has dementia, and that "is in the back of my mind."   She asked about my kids, as usual.    Osteoporosis.  Still going to the gym 5 days a week.  Repeat DXA declined by patient.  d/w pt.  This is reasonable. Vit D prev wnl.  D/w pt.    Heartburn/GERD.  No ADE on med. Compliant with med, used every few days with effect.  She has tapered down on the frequency of PPI use.  No CP, SOB, BLE edema.  Still able to exercise at the gym.    PMH and SH reviewed  ROS: Per HPI unless specifically indicated in ROS section   Meds, vitals, and allergies reviewed.   GEN: nad, alert and oriented HEENT: mucous membranes moist NECK: supple w/o LA CV: rrr PULM: ctab, no inc wob ABD: soft, +bs EXT: no edema SKIN: no acute rash Minimal onychomycosis noted on the distal L 1st toe.

## 2017-01-13 DIAGNOSIS — Z0000 Encounter for general adult medical examination without abnormal findings: Secondary | ICD-10-CM

## 2017-01-13 HISTORY — DX: Encounter for general adult medical examination without abnormal findings: Z00.00

## 2017-01-13 NOTE — Assessment & Plan Note (Signed)
Flu vaccine - addressed Mammogram due next month. D/w pt.   Hearing - failed. Declined hearing aids.   Lipids are reasonable.  D/w pt.   Not diabetic by A1c, d/w pt.   Advance directive-son John designated if patient were incapacitated.

## 2017-01-13 NOTE — Assessment & Plan Note (Signed)
No ADE on med. Compliant with med, used every few days with effect.  She has tapered down on the frequency of PPI use.

## 2017-01-13 NOTE — Assessment & Plan Note (Signed)
Still going to the gym 5 days a week.  Repeat DXA declined by patient.  D/w pt.  This is reasonable. Vit D prev wnl.  D/w pt.  Continue as is.

## 2017-01-13 NOTE — Assessment & Plan Note (Addendum)
Scored 20/20 on recent testing.  She has noted some slight memory changes over the years but stable compared to recent levels.  Compliant with meds.  She has had troubles recalling people's names, more in later life.  No red flag events.  She was able to cope with her current situation.  No ADE on med.  Her mother has dementia, and that "is in the back of my mind."  We agreed that it was likely reasonable to continue as is with her meds. Update me as needed.  Recheck periodically.  >25 minutes spent in face to face time with patient, >50% spent in counselling or coordination of care.

## 2017-01-13 NOTE — Assessment & Plan Note (Signed)
Advance directive-son John designated if patient were incapacitated. °

## 2017-02-05 LAB — HM MAMMOGRAPHY

## 2017-02-16 ENCOUNTER — Encounter: Payer: Self-pay | Admitting: Family Medicine

## 2017-03-09 ENCOUNTER — Ambulatory Visit (INDEPENDENT_AMBULATORY_CARE_PROVIDER_SITE_OTHER): Payer: Medicare Other | Admitting: Podiatry

## 2017-03-09 ENCOUNTER — Encounter: Payer: Self-pay | Admitting: Podiatry

## 2017-03-09 VITALS — BP 182/83 | HR 74 | Ht 64.0 in | Wt 181.0 lb

## 2017-03-09 DIAGNOSIS — M722 Plantar fascial fibromatosis: Secondary | ICD-10-CM | POA: Diagnosis not present

## 2017-03-09 DIAGNOSIS — M6700 Short Achilles tendon (acquired), unspecified ankle: Secondary | ICD-10-CM | POA: Insufficient documentation

## 2017-03-09 HISTORY — DX: Plantar fascial fibromatosis: M72.2

## 2017-03-09 NOTE — Patient Instructions (Signed)
Seen for painful heel R>L. Noted of tight Achilles tendon R>L. Need daily stretch exercise. May use Advil as needed. May try OTC orthotics and adjust exercise to stationary bike if necessary. Return as needed.

## 2017-03-09 NOTE — Progress Notes (Signed)
SUBJECTIVE: 83 y.o. year old female presents stating that her right foot started to hurt on heel after been on Treadmill exercise. In the morning is bad when getting out of bed. Now unable to do her normal exercise. Patient is referred by Dr. Damita Dunnings.   Review of Systems  Constitutional: Negative.   HENT: Negative.   Eyes: Negative.   Respiratory: Negative.   Cardiovascular: Negative.   Gastrointestinal: Negative.   Genitourinary: Negative.   Musculoskeletal: Negative.   Skin: Negative.    OBJECTIVE: DERMATOLOGIC EXAMINATION: Thick dystrophic left great toe under treatment with OTC drops. Broad plantar callus under the first MPJ bilateral.  VASCULAR EXAMINATION OF LOW ER LIMBS: All pedal pulses are palpable with normal pulsation.  Capillary Filling times within 3 seconds in all digits.  No edema or erythema noted. Temperature gradient from tibial crest to dorsum of foot is within normal bilateral.  NEUROLOGIC EXAMINATION OF THE LOWER LIMBS: Achilles DTR is present and within normal Monofilament (Semmes-Weinstein 10-gm) sensory testing positive 6 out of 6, bilateral. Vibratory sensations(128Hz  turning fork) intact at medial and lateral forefoot bilateral.  Sharp and Dull discriminatory sensations at the plantar ball of hallux is intact bilateral.   MUSCULOSKELETAL EXAMINATION: Positive for high arched cavus type foot with hyperextended first MPJ right. Tight Achilles tendon R>L. Hypermobile first ray bilateral. Forefoot varus bilateral. Pain in right heel in the morning and after exercise.  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 Plantar fasciitis right. Metatarsal deformity bilateral.  PLAN: Reviewed clinical findings and available treatment options, NSAIA, injection, orthotics custom/otc, proper shoe gear, change in activities, stretch exercise. Reviewed daily exercise to stretch tight Achilles tendon. Patient will try this first before get referral to  physical therapy. Patient wishes to wait on injection. OTC orthotic dispensed. Proper shoe gear and change is activity reviewed. May take Advil as needed. Patient will return if fail to improve.

## 2017-07-15 ENCOUNTER — Telehealth: Payer: Self-pay | Admitting: Family Medicine

## 2017-07-15 NOTE — Telephone Encounter (Signed)
Noted last refill on Donepezil (Aricept) was 01/12/17 ; #90; Refills x 3.  Attempted to call the pt.  Left voice message that this medication was refilled in September, for an entire year;  Advised to notify [facility] that she needs a refill.  Advised to call back if she has further questions.

## 2017-07-15 NOTE — Telephone Encounter (Signed)
Copied from [place] [id number]. Topic: Quick Communication - Rx Refill/Question >> Jul 15, 2017  9:13 AM Antonieta Iba C wrote:   Medication:  donepezil (ARICEPT) 10 MG tablet   Has the patient contacted their pharmacy? Yes    (Agent: If no, request that the patient contact the pharmacy for the refill.)   Preferred Pharmacy (with phone number or street name): [facility], [place] to [facility]  Agent: Please be advised that RX refills may take up to 3 business days. We ask that you follow-up with your pharmacy.

## 2018-01-16 ENCOUNTER — Other Ambulatory Visit: Payer: Self-pay | Admitting: Family Medicine

## 2018-01-16 DIAGNOSIS — R739 Hyperglycemia, unspecified: Secondary | ICD-10-CM

## 2018-01-16 DIAGNOSIS — E785 Hyperlipidemia, unspecified: Secondary | ICD-10-CM

## 2018-01-16 DIAGNOSIS — M810 Age-related osteoporosis without current pathological fracture: Secondary | ICD-10-CM

## 2018-01-18 ENCOUNTER — Ambulatory Visit: Payer: Medicare Other

## 2018-01-18 ENCOUNTER — Ambulatory Visit (INDEPENDENT_AMBULATORY_CARE_PROVIDER_SITE_OTHER): Payer: Medicare Other

## 2018-01-18 VITALS — BP 124/80 | HR 72 | Temp 97.6°F | Ht 63.5 in | Wt 179.0 lb

## 2018-01-18 DIAGNOSIS — R739 Hyperglycemia, unspecified: Secondary | ICD-10-CM

## 2018-01-18 DIAGNOSIS — Z23 Encounter for immunization: Secondary | ICD-10-CM | POA: Diagnosis not present

## 2018-01-18 DIAGNOSIS — Z0000 Encounter for general adult medical examination without abnormal findings: Secondary | ICD-10-CM

## 2018-01-18 DIAGNOSIS — E785 Hyperlipidemia, unspecified: Secondary | ICD-10-CM | POA: Diagnosis not present

## 2018-01-18 DIAGNOSIS — M810 Age-related osteoporosis without current pathological fracture: Secondary | ICD-10-CM | POA: Diagnosis not present

## 2018-01-18 LAB — CBC WITH DIFFERENTIAL/PLATELET
BASOS PCT: 0.8 % (ref 0.0–3.0)
Basophils Absolute: 0.1 10*3/uL (ref 0.0–0.1)
EOS ABS: 0.1 10*3/uL (ref 0.0–0.7)
Eosinophils Relative: 1.1 % (ref 0.0–5.0)
HEMATOCRIT: 39.7 % (ref 36.0–46.0)
Hemoglobin: 13 g/dL (ref 12.0–15.0)
LYMPHS ABS: 4.3 10*3/uL — AB (ref 0.7–4.0)
Lymphocytes Relative: 38.9 % (ref 12.0–46.0)
MCHC: 32.8 g/dL (ref 30.0–36.0)
MCV: 87 fl (ref 78.0–100.0)
MONOS PCT: 5.6 % (ref 3.0–12.0)
Monocytes Absolute: 0.6 10*3/uL (ref 0.1–1.0)
NEUTROS ABS: 6 10*3/uL (ref 1.4–7.7)
NEUTROS PCT: 53.6 % (ref 43.0–77.0)
PLATELETS: 220 10*3/uL (ref 150.0–400.0)
RBC: 4.57 Mil/uL (ref 3.87–5.11)
RDW: 15.2 % (ref 11.5–15.5)
WBC: 11.1 10*3/uL — ABNORMAL HIGH (ref 4.0–10.5)

## 2018-01-18 LAB — COMPREHENSIVE METABOLIC PANEL
ALT: 18 U/L (ref 0–35)
AST: 21 U/L (ref 0–37)
Albumin: 4 g/dL (ref 3.5–5.2)
Alkaline Phosphatase: 55 U/L (ref 39–117)
BUN: 15 mg/dL (ref 6–23)
CHLORIDE: 103 meq/L (ref 96–112)
CO2: 30 meq/L (ref 19–32)
Calcium: 9.8 mg/dL (ref 8.4–10.5)
Creatinine, Ser: 0.94 mg/dL (ref 0.40–1.20)
GFR: 59.85 mL/min — AB (ref >60.00)
Glucose, Bld: 94 mg/dL (ref 70–99)
POTASSIUM: 4.6 meq/L (ref 3.5–5.1)
SODIUM: 138 meq/L (ref 135–145)
Total Bilirubin: 0.6 mg/dL (ref 0.2–1.2)
Total Protein: 7.2 g/dL (ref 6.0–8.3)

## 2018-01-18 LAB — LIPID PANEL
CHOL/HDL RATIO: 4
Cholesterol: 181 mg/dL (ref 0–200)
HDL: 48.6 mg/dL (ref >39.00)
LDL CALC: 115 mg/dL — AB (ref 0–99)
NonHDL: 132.57
Triglycerides: 89 mg/dL (ref 0.0–149.0)
VLDL: 17.8 mg/dL (ref 0.0–40.0)

## 2018-01-18 LAB — HEMOGLOBIN A1C: Hgb A1c MFr Bld: 6.2 % (ref 4.6–6.5)

## 2018-01-18 LAB — VITAMIN D 25 HYDROXY (VIT D DEFICIENCY, FRACTURES): VITD: 54.73 ng/mL (ref 30.00–100.00)

## 2018-01-18 NOTE — Patient Instructions (Signed)
Ms. Tischer , Thank you for taking time to come for your Medicare Wellness Visit. I appreciate your ongoing commitment to your health goals. Please review the following plan we discussed and let me know if I can assist you in the future.   These are the goals we discussed: Goals    . Increase physical activity     Starting 01/18/2018, I will continue to exercise for 45-60 min 5 days per week.        This is a list of the screening recommended for you and due dates:  Health Maintenance  Topic Date Due  . DEXA scan (bone density measurement)  01/03/2019*  . Tetanus Vaccine  09/08/2018  . Flu Shot  Completed  . Pneumonia vaccines  Completed  *Topic was postponed. The date shown is not the original due date.   Preventive Care for Adults  A healthy lifestyle and preventive care can promote health and wellness. Preventive health guidelines for adults include the following key practices.  . A routine yearly physical is a good way to check with your health care provider about your health and preventive screening. It is a chance to share any concerns and updates on your health and to receive a thorough exam.  . Visit your dentist for a routine exam and preventive care every 6 months. Brush your teeth twice a day and floss once a day. Good oral hygiene prevents tooth decay and gum disease.  . The frequency of eye exams is based on your age, health, family medical history, use  of contact lenses, and other factors. Follow your health care provider's recommendations for frequency of eye exams.  . Eat a healthy diet. Foods like vegetables, fruits, whole grains, low-fat dairy products, and lean protein foods contain the nutrients you need without too many calories. Decrease your intake of foods high in solid fats, added sugars, and salt. Eat the right amount of calories for you. Get information about a proper diet from your health care provider, if necessary.  . Regular physical exercise is one of  the most important things you can do for your health. Most adults should get at least 150 minutes of moderate-intensity exercise (any activity that increases your heart rate and causes you to sweat) each week. In addition, most adults need muscle-strengthening exercises on 2 or more days a week.  Silver Sneakers may be a benefit available to you. To determine eligibility, you may visit the website: www.silversneakers.com or contact program at 930-840-5472 Mon-Fri between 8AM-8PM.   . Maintain a healthy weight. The body mass index (BMI) is a screening tool to identify possible weight problems. It provides an estimate of body fat based on height and weight. Your health care provider can find your BMI and can help you achieve or maintain a healthy weight.   For adults 20 years and older: ? A BMI below 18.5 is considered underweight. ? A BMI of 18.5 to 24.9 is normal. ? A BMI of 25 to 29.9 is considered overweight. ? A BMI of 30 and above is considered obese.   . Maintain normal blood lipids and cholesterol levels by exercising and minimizing your intake of saturated fat. Eat a balanced diet with plenty of fruit and vegetables. Blood tests for lipids and cholesterol should begin at age 43 and be repeated every 5 years. If your lipid or cholesterol levels are high, you are over 50, or you are at high risk for heart disease, you may need your cholesterol levels  checked more frequently. Ongoing high lipid and cholesterol levels should be treated with medicines if diet and exercise are not working.  . If you smoke, find out from your health care provider how to quit. If you do not use tobacco, please do not start.  . If you choose to drink alcohol, please do not consume more than 2 drinks per day. One drink is considered to be 12 ounces (355 mL) of beer, 5 ounces (148 mL) of wine, or 1.5 ounces (44 mL) of liquor.  . If you are 36-16 years old, ask your health care provider if you should take aspirin to  prevent strokes.  . Use sunscreen. Apply sunscreen liberally and repeatedly throughout the day. You should seek shade when your shadow is shorter than you. Protect yourself by wearing long sleeves, pants, a wide-brimmed hat, and sunglasses year round, whenever you are outdoors.  . Once a month, do a whole body skin exam, using a mirror to look at the skin on your back. Tell your health care provider of new moles, moles that have irregular borders, moles that are larger than a pencil eraser, or moles that have changed in shape or color.

## 2018-01-18 NOTE — Progress Notes (Signed)
PCP notes:   Health maintenance:  Flu vaccine - administered  Abnormal screenings:   Hearing - failed  Hearing Screening   125Hz  250Hz  500Hz  1000Hz  2000Hz  3000Hz  4000Hz  6000Hz  8000Hz   Right ear:   40 40 40  0    Left ear:   40 40 40  0       Patient concerns:   None  Nurse concerns:  None  Next PCP appt:   01/21/2018 @ 0845  I reviewed health advisor's note, was available for consultation on the day of service listed in this note, and agree with documentation and plan. Elsie Stain, MD.

## 2018-01-18 NOTE — Progress Notes (Signed)
Subjective:   Katrina Armstrong is a 84 y.o. female who presents for Medicare Annual (Subsequent) preventive examination.  Review of Systems:  N/A Cardiac Risk Factors include: advanced age (>49men, >3 women);dyslipidemia;obesity (BMI >30kg/m2)     Objective:     Vitals: BP 124/80 (BP Location: Left Arm, Patient Position: Sitting, Cuff Size: Normal)   Pulse 72   Temp 97.6 F (36.4 C) (Oral)   Ht 5' 3.5" (1.613 m) Comment: shoes  Wt 179 lb (81.2 kg)   SpO2 96%   BMI 31.21 kg/m   Body mass index is 31.21 kg/m.  Advanced Directives 01/18/2018 01/05/2017 01/31/2016  Does Patient Have a Medical Advance Directive? No Yes Yes  Type of Advance Directive - [place] will  [place] in Chart? - No - copy requested No - copy requested  Would patient like information on creating a medical advance directive? No - Patient declined - -    Tobacco Social History   Tobacco Use  Smoking Status Former Smoker  . Last attempt to quit: 05/11/1978  . Years since quitting: 39.7  Smokeless Tobacco Never Used     Counseling given: No   Clinical Intake:  Pre-visit preparation completed: Yes  Pain : No/denies pain Pain Score: 0-No pain     Nutritional Status: BMI > 30  Obese Nutritional Risks: None Diabetes: No  How often do you need to have someone help you when you read instructions, pamphlets, or other written materials from your doctor or pharmacy?: 1 - Never What is the last grade level you completed in school?: 12th grade  Interpreter Needed?: No  Comments: pt is a widow and lives alone Information entered by :: LPinson, LPN  Past Medical History:  Diagnosis Date  . Alzheimer's dementia   . GERD (gastroesophageal reflux disease)   . History of cardiovascular stress test 06/05/09   [facility]  Echo- mild AS/mod LVH. Myocardial perfusion imaging is normal. Overall left ventricular function is normal without regional wall  motion abnormalities.  . Hyperlipidemia   . Memory loss   . Normal echocardiogram    12/2010, EF 55-60%  . Osteoporosis, unspecified   . Personal history of colonic polyps   . Personal history of other diseases of digestive system   . Pulmonary embolism ([place])    12/2010  . Wears dentures    full upper and lower   Past Surgical History:  Procedure Laterality Date  . APPENDECTOMY    . HEMORROIDECTOMY    . MASS EXCISION Right 01/31/2016   Procedure: EXCISION MASS RIGHT LOWER LIP;  Surgeon: Beverly Gust, MD;  Location: [facility];  Service: ENT;  Laterality: Right;   Family History  Problem Relation Age of Onset  . Hypertension Mother   . Dementia Mother   . Melanoma Father   . Hypertension Unknown        fam hx  . Stroke Unknown        sibling  . Other Unknown        PNA- sibling/brain tumor- sibling  . Cancer Sister        brain tumor  . Breast cancer Neg Hx   . Colon cancer Neg Hx    Social History   Socioeconomic History  . Marital status: Widowed    Spouse name: Not on file  . Number of children: 1  . Years of education: 54  . Highest education level: Not on file  Occupational  History  . 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 Financial resource strain: Not on file  . Food insecurity:    Worry: Not on file    Inability: Not on file  . Transportation needs:    Medical: Not on file    Non-medical: Not on file  Tobacco Use  . Smoking status: Former Smoker    Last attempt to quit: 05/11/1978    Years since quitting: 39.7  . Smokeless tobacco: Never Used  Substance and Sexual Activity  . Alcohol use: No  . Drug use: No  . Sexual activity: Not on file  Lifestyle  . Physical activity:    Days per week: Not on file    Minutes per session: Not on file  . Stress: Not on file  Relationships  . Social connections:    Talks on phone: Not on file    Gets together: Not on file    Attends religious  service: Not on file    Active member of club or organization: Not on file    Attends meetings of clubs or organizations: Not on file    Relationship status: Not on file  Other Topics Concern  . Not on file  Social History Narrative   All siblings dead, all sisters   Is from [place], [place]   Widowed as of 2011, lives alone   1 son, [name]   Regular exercise: yes, goes to the gym mult days per week.    Enjoys crafts, church/senior activity    Outpatient Encounter Medications as of 01/18/2018  Medication Sig  . Calcium Carb-Cholecalciferol 600-400 MG-UNIT TABS Take 1 tablet by mouth daily.   . Cholecalciferol (VITAMIN D) 2000 UNITS CAPS Take 1 capsule by mouth daily.  . Cyanocobalamin (B-12 PO) Take 1 tablet by mouth daily.  Marland Kitchen docusate sodium (STOOL SOFTENER) 100 MG capsule Take 100 mg by mouth as needed.   . donepezil (ARICEPT) 10 MG tablet Take 1 tablet (10 mg total) by mouth daily.  . Multiple Vitamin (MULTIVITAMIN WITH MINERALS) TABS Take 1 tablet by mouth daily.  . [DISCONTINUED] omeprazole (PRILOSEC) 20 MG capsule TAKE 1 CAPSULE DAILY AS NEEDED FOR ACID REFLUX   No facility-administered encounter medications on file as of 01/18/2018.     Activities of Daily Living In your present state of health, do you have any difficulty performing the following activities: 01/18/2018  Hearing? N  Vision? N  Difficulty concentrating or making decisions? Y  Walking or climbing stairs? N  Dressing or bathing? N  Doing errands, shopping? N  Preparing Food and eating ? N  Using the Toilet? N  In the past six months, have you accidently leaked urine? N  Do you have problems with loss of bowel control? N  Managing your Medications? N  Managing your Finances? N  Housekeeping or managing your Housekeeping? N  Some recent data might be hidden    Patient Care Team: Tonia Ghent, MD as PCP - General    Assessment:   This is a routine wellness examination for Katrina Armstrong.  Exercise Activities  and Dietary recommendations Current Exercise Habits: Home exercise routine, Time (Minutes): 60, Frequency (Times/Week): 5, Weekly Exercise (Minutes/Week): 300, Intensity: Moderate, Exercise limited by: None identified  Goals    . Increase physical activity     Starting 01/18/2018, I will continue to exercise for 45-60 min 5 days per week.  Fall Risk Fall Risk  01/18/2018 01/05/2017 01/03/2016 12/31/2014 12/18/2013  Falls in the past year? No No No No No   Depression Screen PHQ 2/9 Scores 01/18/2018 01/05/2017 01/03/2016 12/31/2014  PHQ - 2 Score 0 0 0 0  PHQ- 9 Score 0 0 - -     Cognitive Function MMSE - Mini Mental State Exam 01/18/2018 01/05/2017  Orientation to time 5 5  Orientation to Place 5 5  Registration 3 3  Attention/ Calculation 0 0  Recall 3 3  Language- name 2 objects 0 0  Language- repeat 1 1  Language- follow 3 step command 3 3  Language- read & follow direction 0 0  Write a sentence 0 0  Copy design 0 0  Total score 20 20     PLEASE NOTE: A Mini-Cog screen was completed. Maximum score is 20. A value of 0 denotes this part of [place] MMSE was not completed or the patient failed this part of the Mini-Cog screening.   Mini-Cog Screening Orientation to Time - Max 5 pts Orientation to Place - Max 5 pts Registration - Max 3 pts Recall - Max 3 pts Language Repeat - Max 1 pts Language Follow 3 Step Command - Max 3 pts     Immunization History  Administered Date(s) Administered  . Influenza,inj,Quad PF,6+ Mos 01/03/2016, 01/12/2017, 01/18/2018  . Influenza-Unspecified 02/19/2014  . Pneumococcal Conjugate-13 12/18/2013  . Pneumococcal Polysaccharide-23 05/11/2005, 12/09/2010  . Td 09/07/2008    Screening Tests Health Maintenance  Topic Date Due  . DEXA SCAN  01/03/2019 (Originally 12/04/1995)  . TETANUS/TDAP  09/08/2018  . INFLUENZA VACCINE  Completed  . PNA vac Low Risk Adult  Completed      Plan:     I have personally reviewed, addressed, and noted  the following in the patient's chart:  A. Medical and social history B. Use of alcohol, tobacco or illicit drugs  C. Current medications and supplements D. Functional ability and status E.  Nutritional status F.  Physical activity G. Advance directives H. List of other physicians I.  Hospitalizations, surgeries, and ER visits in previous 12 months J.  [place] to include hearing, vision, cognitive, depression L. Referrals and appointments - none  In addition, I have reviewed and discussed with patient certain preventive protocols, quality metrics, and best practice recommendations. A written personalized care plan for preventive services as well as general preventive health recommendations were provided to patient.  See attached scanned questionnaire for additional information.   Signed,   Lindell Noe, MHA, BS, LPN Health Coach

## 2018-01-21 ENCOUNTER — Encounter: Payer: Self-pay | Admitting: Family Medicine

## 2018-01-21 ENCOUNTER — Ambulatory Visit (INDEPENDENT_AMBULATORY_CARE_PROVIDER_SITE_OTHER): Payer: Medicare Other | Admitting: Family Medicine

## 2018-01-21 VITALS — BP 124/80 | HR 72 | Temp 97.6°F | Ht 63.5 in | Wt 179.0 lb

## 2018-01-21 DIAGNOSIS — I8390 Asymptomatic varicose veins of unspecified lower extremity: Secondary | ICD-10-CM

## 2018-01-21 DIAGNOSIS — R413 Other amnesia: Secondary | ICD-10-CM | POA: Diagnosis not present

## 2018-01-21 DIAGNOSIS — Z0000 Encounter for general adult medical examination without abnormal findings: Secondary | ICD-10-CM

## 2018-01-21 DIAGNOSIS — Z7189 Other specified counseling: Secondary | ICD-10-CM

## 2018-01-21 MED ORDER — DONEPEZIL HCL 10 MG PO TABS: 10.0000 mg | ORAL_TABLET | Freq: Every day | ORAL | 3 refills | Status: DC

## 2018-01-21 NOTE — Progress Notes (Signed)
Memory changes.  Still on donepezil.  Son lives next door.  She lives alone. No falls.  Still driving.  She thought she had occ lapses of memories. She'll occ forget names.  No red flag events.  20/20 on recent memory testing.  D/w pt about her situation.  I am uncertain about the amount of benefit from the medicine but she is stable and no apparent ADE and it likely makes sense to continue as is.   She agrees.   In reference to her memory, she asks about my kids again by name, per her usual routine.  She has more ropey veins in the L leg.  No bleeding or pain.  Superficial veins on the L leg, not ttp.  D/w pt about mgmt if she had sx, which she doesn't and hasn't.    She has some R knee pain that responds to a local knee brace with exercise.  She is still going to the gym 5 times a week, at baseline.    Hearing aids declined.   Flu shot 2019.  Tetanus 2010 PNA up to date.   Mammogram due soon, she is scheduled. D/w pt.   DXA deferred given her hx.  She agrees.   Lipids are reasonable.  D/w pt.   Not diabetic by A1c, d/w pt.   Advance directive-son John designated if patient were incapacitated.   PMH and SH reviewed  ROS: Per HPI unless specifically indicated in ROS section   Meds, vitals, and allergies reviewed.   GEN: nad, alert and oriented HEENT: mucous membranes moist NECK: supple w/o LA CV: rrr PULM: ctab, no inc wob ABD: soft, +bs EXT: no edema SKIN: no acute rash but varicose veins noted on the left leg, not tender.  No bleeding.

## 2018-01-21 NOTE — Patient Instructions (Addendum)
The varicose veins on your legs shouldn't be a problem and aren't related to the prev clot.  If you have pain or troubles, then let me know.  You can use a compression stocking or support hose as needed.   Keep exercising.  Update me as needed.   Thanks for your effort.  Take care.  Glad to see you.

## 2018-01-23 DIAGNOSIS — I8390 Asymptomatic varicose veins of unspecified lower extremity: Secondary | ICD-10-CM

## 2018-01-23 HISTORY — DX: Asymptomatic varicose veins of unspecified lower extremity: I83.90

## 2018-01-23 NOTE — Assessment & Plan Note (Signed)
She has not had symptoms and does not have symptoms currently.  Discussed.  She will update me as needed.

## 2018-01-23 NOTE — Assessment & Plan Note (Signed)
Advance directive-son John designated if patient were incapacitated. °

## 2018-01-23 NOTE — Assessment & Plan Note (Addendum)
Still on donepezil.  Son lives next door.  She lives alone. No falls.  Still driving.  She thought she had occ lapses of memories. She'll occ forget names.  No red flag events.  20/20 on recent memory testing.  D/w pt about her situation.  I am uncertain about the amount of benefit from the medicine but she is stable and no apparent ADE and it likely makes sense to continue as is.   She agrees.  Follow clinically. >25 minutes spent in face to face time with patient, >50% spent in counselling or coordination of care.

## 2018-01-23 NOTE — Assessment & Plan Note (Signed)
Hearing aids declined.   Flu shot 2019.  Tetanus 2010 PNA up to date.   Mammogram due soon, she is scheduled. D/w pt.   DXA deferred given her hx.  She agrees.   Lipids are reasonable.  D/w pt.   Not diabetic by A1c, d/w pt.   Advance directive-son John designated if patient were incapacitated.

## 2018-02-07 LAB — HM MAMMOGRAPHY

## 2018-02-14 ENCOUNTER — Encounter: Payer: Self-pay | Admitting: Family Medicine

## 2018-07-05 ENCOUNTER — Ambulatory Visit: Payer: Medicare Other | Admitting: Family Medicine

## 2018-07-05 ENCOUNTER — Encounter: Payer: Self-pay | Admitting: Family Medicine

## 2018-07-05 VITALS — BP 170/84 | HR 87 | Temp 98.4°F | Ht 63.5 in | Wt 171.4 lb

## 2018-07-05 DIAGNOSIS — K5900 Constipation, unspecified: Secondary | ICD-10-CM

## 2018-07-05 LAB — COMPREHENSIVE METABOLIC PANEL
ALBUMIN: 3.9 g/dL (ref 3.5–5.2)
ALT: 23 U/L (ref 0–35)
AST: 22 U/L (ref 0–37)
Alkaline Phosphatase: 48 U/L (ref 39–117)
BUN: 20 mg/dL (ref 6–23)
CHLORIDE: 100 meq/L (ref 96–112)
CO2: 29 meq/L (ref 19–32)
Calcium: 9.7 mg/dL (ref 8.4–10.5)
Creatinine, Ser: 0.95 mg/dL (ref 0.40–1.20)
GFR: 55.57 mL/min — ABNORMAL LOW (ref >60.00)
Glucose, Bld: 104 mg/dL — ABNORMAL HIGH (ref 70–99)
Potassium: 4.4 mEq/L (ref 3.5–5.1)
Sodium: 136 mEq/L (ref 135–145)
Total Bilirubin: 0.3 mg/dL (ref 0.2–1.2)
Total Protein: 6.8 g/dL (ref 6.0–8.3)

## 2018-07-05 LAB — CBC WITH DIFFERENTIAL/PLATELET
BASOS PCT: 1.1 % (ref 0.0–3.0)
Basophils Absolute: 0.1 10*3/uL (ref 0.0–0.1)
EOS PCT: 1.7 % (ref 0.0–5.0)
Eosinophils Absolute: 0.2 10*3/uL (ref 0.0–0.7)
HEMATOCRIT: 40.5 % (ref 36.0–46.0)
HEMOGLOBIN: 13.5 g/dL (ref 12.0–15.0)
LYMPHS PCT: 40.2 % (ref 12.0–46.0)
Lymphs Abs: 3.8 10*3/uL (ref 0.7–4.0)
MCHC: 33.4 g/dL (ref 30.0–36.0)
MCV: 87.8 fl (ref 78.0–100.0)
Monocytes Absolute: 0.5 10*3/uL (ref 0.1–1.0)
Monocytes Relative: 5.3 % (ref 3.0–12.0)
Neutro Abs: 4.9 10*3/uL (ref 1.4–7.7)
Neutrophils Relative %: 51.7 % (ref 43.0–77.0)
Platelets: 215 10*3/uL (ref 150.0–400.0)
RBC: 4.62 Mil/uL (ref 3.87–5.11)
RDW: 15.8 % — AB (ref 11.5–15.5)
WBC: 9.4 10*3/uL (ref 4.0–10.5)

## 2018-07-05 MED ORDER — POLYETHYLENE GLYCOL 3350 17 GM/SCOOP PO POWD: 17.0000 g | Freq: Every day | ORAL | Status: AC | PRN

## 2018-07-05 NOTE — Patient Instructions (Signed)
Increase fruits and vegetables by a few servings a day.  Use miralax if needed.  We'll call about getting you set up with GI.  Go to the lab on the way out.  We'll contact you with your lab report. Take care.  Glad to see you.

## 2018-07-05 NOTE — Progress Notes (Signed)
She started having trouble with constipation in the last few weeks.  She has some hemorrhoid irritation.  Used OTC cream for that.  She felt a hard knot on internal self exam.  Colonoscopy with hemorrhoid, diverticulosis, and a small polyp in 2011.  No blood seen in stool.  Some L sided abd pain, episodically.  No FCNAVD.   She is cooking less and not eating as much per meal, at baseline.  She has dec in appetite.    She is still going to the gym most days per week.   Meds, vitals, and allergies reviewed.   ROS: Per HPI unless specifically indicated in ROS section   GEN: nad, alert and oriented HEENT: mucous membranes moist NECK: supple w/o LA CV: rrr PULM: ctab, no inc wob ABD: soft, +bs, not ttp.   EXT: no edema SKIN: well perfused.

## 2018-07-06 DIAGNOSIS — K5900 Constipation, unspecified: Secondary | ICD-10-CM | POA: Insufficient documentation

## 2018-07-06 HISTORY — DX: Constipation, unspecified: K59.00

## 2018-07-06 NOTE — Assessment & Plan Note (Signed)
She was worried about the possibility of colon cancer.  Discussed.  I would like her to see the GI clinic. Check routine labs today. Increase fruits and vegetables by a few servings a day, to see if that will help with constipation. Use miralax if needed.  Okay for outpatient follow-up.  She agrees to plan.

## 2018-12-06 ENCOUNTER — Telehealth: Payer: Self-pay | Admitting: Family Medicine

## 2018-12-06 NOTE — Telephone Encounter (Signed)
Patient is requested a call back. She stated she has some questions. She is having some trouble with her feet. And stated she has some blisters coming up in between her toes.   Please advise  C/B # [phone]

## 2018-12-06 NOTE — Telephone Encounter (Signed)
Patient advised.

## 2018-12-06 NOTE — Telephone Encounter (Signed)
I would try OTC ketoconazole cream as this could be fungal.  If she can send a picture that would help.  If worse, then needs OV.  Thanks.

## 2018-12-06 NOTE — Telephone Encounter (Signed)
Patient says she had some poison oak on the left foot but used some OTC ointment and that resolved but then she started getting blister-like lesions between her toes on the left initially, then on the right foot.  These areas do not itch or cause pain but she says at night, it is more bothersome.  Patient says she soaked them in [facility] last evening and it seemed to help some but not resolve.  Does she need an OV?

## 2019-01-16 ENCOUNTER — Other Ambulatory Visit: Payer: Self-pay | Admitting: Family Medicine

## 2019-01-16 DIAGNOSIS — R413 Other amnesia: Secondary | ICD-10-CM

## 2019-01-16 DIAGNOSIS — E785 Hyperlipidemia, unspecified: Secondary | ICD-10-CM

## 2019-01-16 DIAGNOSIS — M810 Age-related osteoporosis without current pathological fracture: Secondary | ICD-10-CM

## 2019-01-16 DIAGNOSIS — R739 Hyperglycemia, unspecified: Secondary | ICD-10-CM

## 2019-01-19 ENCOUNTER — Other Ambulatory Visit (INDEPENDENT_AMBULATORY_CARE_PROVIDER_SITE_OTHER): Payer: Medicare Other

## 2019-01-19 ENCOUNTER — Other Ambulatory Visit: Payer: Self-pay

## 2019-01-19 DIAGNOSIS — R739 Hyperglycemia, unspecified: Secondary | ICD-10-CM | POA: Diagnosis not present

## 2019-01-19 DIAGNOSIS — E785 Hyperlipidemia, unspecified: Secondary | ICD-10-CM

## 2019-01-19 DIAGNOSIS — R413 Other amnesia: Secondary | ICD-10-CM

## 2019-01-19 DIAGNOSIS — M810 Age-related osteoporosis without current pathological fracture: Secondary | ICD-10-CM

## 2019-01-19 LAB — CBC WITH DIFFERENTIAL/PLATELET
Basophils Absolute: 0.2 10*3/uL — ABNORMAL HIGH (ref 0.0–0.1)
Basophils Relative: 1.7 % (ref 0.0–3.0)
Eosinophils Absolute: 0.2 10*3/uL (ref 0.0–0.7)
Eosinophils Relative: 1.5 % (ref 0.0–5.0)
HCT: 40.7 % (ref 36.0–46.0)
Hemoglobin: 13.1 g/dL (ref 12.0–15.0)
Lymphocytes Relative: 44.2 % (ref 12.0–46.0)
Lymphs Abs: 5.3 10*3/uL — ABNORMAL HIGH (ref 0.7–4.0)
MCHC: 32.3 g/dL (ref 30.0–36.0)
MCV: 88.9 fl (ref 78.0–100.0)
Monocytes Absolute: 0.7 10*3/uL (ref 0.1–1.0)
Monocytes Relative: 6 % (ref 3.0–12.0)
Neutro Abs: 5.6 10*3/uL (ref 1.4–7.7)
Neutrophils Relative %: 46.6 % (ref 43.0–77.0)
Platelets: 239 10*3/uL (ref 150.0–400.0)
RBC: 4.58 Mil/uL (ref 3.87–5.11)
RDW: 15.1 % (ref 11.5–15.5)
WBC: 12 10*3/uL — ABNORMAL HIGH (ref 4.0–10.5)

## 2019-01-19 LAB — COMPREHENSIVE METABOLIC PANEL
ALT: 16 U/L (ref 0–35)
AST: 24 U/L (ref 0–37)
Albumin: 3.8 g/dL (ref 3.5–5.2)
Alkaline Phosphatase: 50 U/L (ref 39–117)
BUN: 17 mg/dL (ref 6–23)
CO2: 28 mEq/L (ref 19–32)
Calcium: 9.1 mg/dL (ref 8.4–10.5)
Chloride: 99 mEq/L (ref 96–112)
Creatinine, Ser: 0.96 mg/dL (ref 0.40–1.20)
GFR: 54.83 mL/min — ABNORMAL LOW (ref >60.00)
Glucose, Bld: 93 mg/dL (ref 70–99)
Potassium: 4 mEq/L (ref 3.5–5.1)
Sodium: 135 mEq/L (ref 135–145)
Total Bilirubin: 0.7 mg/dL (ref 0.2–1.2)
Total Protein: 6.8 g/dL (ref 6.0–8.3)

## 2019-01-19 LAB — LIPID PANEL
Cholesterol: 186 mg/dL (ref 0–200)
HDL: 45.8 mg/dL (ref >39.00)
LDL Cholesterol: 123 mg/dL — ABNORMAL HIGH (ref 0–99)
NonHDL: 140.07
Total CHOL/HDL Ratio: 4
Triglycerides: 87 mg/dL (ref 0.0–149.0)
VLDL: 17.4 mg/dL (ref 0.0–40.0)

## 2019-01-19 LAB — HEMOGLOBIN A1C: Hgb A1c MFr Bld: 6.1 % (ref 4.6–6.5)

## 2019-01-19 LAB — VITAMIN D 25 HYDROXY (VIT D DEFICIENCY, FRACTURES): VITD: 65.49 ng/mL (ref 30.00–100.00)

## 2019-01-20 ENCOUNTER — Ambulatory Visit (INDEPENDENT_AMBULATORY_CARE_PROVIDER_SITE_OTHER): Payer: Medicare Other

## 2019-01-20 ENCOUNTER — Ambulatory Visit: Payer: Medicare Other

## 2019-01-20 VITALS — Ht 64.0 in | Wt 170.0 lb

## 2019-01-20 DIAGNOSIS — Z0000 Encounter for general adult medical examination without abnormal findings: Secondary | ICD-10-CM | POA: Diagnosis not present

## 2019-01-20 NOTE — Progress Notes (Signed)
Subjective:   Katrina Armstrong is a 85 y.o. female who presents for Medicare Annual (Subsequent) preventive examination.  This visit type was conducted due to national recommendations for restrictions regarding the COVID-19 Pandemic (e.g. social distancing). This format is felt to be most appropriate for this patient at this time. All issues noted in this document were discussed and addressed. No physical exam was performed (except for noted visual exam findings with Video Visits). This patient, Ms. Katrina Armstrong, has given permission to perform this visit via telephone. Vital signs may be absent or patient reported.  Patient location:  At home  Nurse location:  At home     Review of Systems:  n/a Cardiac Risk Factors include: advanced age (>20men, >58 women);dyslipidemia     Objective:     Vitals: Ht 5\' 4"  (1.626 m) Comment: per patient   Wt 170 lb (77.1 kg) Comment: per patient   BMI 29.18 kg/m   Body mass index is 29.18 kg/m.  Advanced Directives 01/20/2019 01/18/2018 01/05/2017 01/31/2016  Does Patient Have a Medical Advance Directive? Yes No Yes Yes  Type of Paramedic of [place];Living will - Healthcare Power of [place] will  [place] in Chart? No - copy requested - No - copy requested No - copy requested  Would patient like information on creating a medical advance directive? - No - Patient declined - -    Tobacco Social History   Tobacco Use  Smoking Status Former Smoker   Quit date: 05/11/1978   Years since quitting: 40.7  Smokeless Tobacco Never Used     Counseling given: Not Answered   Clinical Intake:  Pre-visit preparation completed: Yes  Pain : No/denies pain     Nutritional Status: BMI 25 -29 Overweight Nutritional Risks: None Diabetes: No  How often do you need to have someone help you when you read instructions, pamphlets, or other written materials from your doctor or pharmacy?: 1 -  Never What is the last grade level you completed in school?: 12th grade  Interpreter Needed?: No  Information entered by :: NAllen LPN  Past Medical History:  Diagnosis Date   Alzheimer's dementia ([place])    GERD (gastroesophageal reflux disease)    History of cardiovascular stress test 06/05/09   [facility]  Echo- mild AS/mod LVH. Myocardial perfusion imaging is normal. Overall left ventricular function is normal without regional wall motion abnormalities.   Hyperlipidemia    Memory loss    Normal echocardiogram    12/2010, EF 55-60%   Osteoporosis, unspecified    Personal history of colonic polyps    Personal history of other diseases of digestive system    Pulmonary embolism ([place])    12/2010   Wears dentures    full upper and lower   Past Surgical History:  Procedure Laterality Date   APPENDECTOMY     HEMORROIDECTOMY     MASS EXCISION Right 01/31/2016   Procedure: EXCISION MASS RIGHT LOWER LIP;  Surgeon: Beverly Gust, MD;  Location: [facility];  Service: ENT;  Laterality: Right;   Family History  Problem Relation Age of Onset   Hypertension Mother    Dementia Mother    Melanoma Father    Hypertension Other        fam hx   Stroke Other        sibling   Other Other        PNA- sibling/brain tumor- sibling   Cancer Sister  brain tumor   Breast cancer Neg Hx    Colon cancer Neg Hx    Social History   Socioeconomic History   Marital status: Widowed    Spouse name: Not on file   Number of children: 1   Years of education: 71   Highest education level: Not on file  Occupational History   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[employer]: [place] resource strain: Not hard at all   Food insecurity    Worry: Never true    Inability: Never true   Transportation needs    Medical: No    Non-medical: No  Tobacco Use   Smoking status: Former Smoker    Quit date: 05/11/1978     Years since quitting: 40.7   Smokeless tobacco: Never Used  Substance and Sexual Activity   Alcohol use: No   Drug use: No   Sexual activity: Not Currently  Lifestyle   Physical activity    Days per week: 5 days    Minutes per session: 60 min   Stress: Not at all  Relationships   Social connections    Talks on phone: Not on file    Gets together: Not on file    Attends religious service: Not on file    Active member of club or organization: Not on file    Attends meetings of clubs or organizations: Not on file    Relationship status: Not on file  Other Topics Concern   Not on file  Social History Narrative   All siblings dead, all sisters   Is from [place], [place]   Widowed as of 2011, lives alone   1 son, [name]   Regular exercise: yes, goes to the gym mult days per week.    Enjoys crafts, church/senior activity    Outpatient Encounter Medications as of 01/20/2019  Medication Sig   Calcium Carb-Cholecalciferol 600-400 MG-UNIT TABS Take 1 tablet by mouth daily.    Cholecalciferol (VITAMIN D) 2000 UNITS CAPS Take 1 capsule by mouth daily.   Cyanocobalamin (B-12 PO) Take 1 tablet by mouth daily.   docusate sodium (STOOL SOFTENER) 100 MG capsule Take 100 mg by mouth as needed.    donepezil (ARICEPT) 10 MG tablet Take 1 tablet (10 mg total) by mouth daily.   Multiple Vitamin (MULTIVITAMIN WITH MINERALS) TABS Take 1 tablet by mouth daily.   polyethylene glycol powder (GLYCOLAX/MIRALAX) powder Take 17 g by mouth daily as needed for mild constipation.   No facility-administered encounter medications on file as of 01/20/2019.     Activities of Daily Living In your present state of health, do you have any difficulty performing the following activities: 01/20/2019  Hearing? N  Vision? N  Difficulty concentrating or making decisions? Y  Comment some trouble with memory  Walking or climbing stairs? N  Dressing or bathing? N  Doing errands, shopping? N  Preparing Food  and eating ? N  Using the Toilet? N  In the past six months, have you accidently leaked urine? N  Do you have problems with loss of bowel control? N  Managing your Medications? N  Managing your Finances? N  Housekeeping or managing your Housekeeping? N  Some recent data might be hidden    Patient Care Team: Tonia Ghent, MD as PCP - General    Assessment:   This is a routine wellness examination for [place].  Exercise Activities and Dietary recommendations Current  Exercise Habits: Structured exercise class, Type of exercise: treadmill;strength training/weights, Time (Minutes): 60, Frequency (Times/Week): 5, Weekly Exercise (Minutes/Week): 300  Goals     Increase physical activity     Starting 01/18/2018, I will continue to exercise for 45-60 min 5 days per week.      Patient Stated     01/20/2019, wants to stay healthy       Fall Risk Fall Risk  01/20/2019 01/18/2018 01/05/2017 01/03/2016 12/31/2014  Falls in the past year? 0 No No No No  Follow up Falls evaluation completed;Falls prevention discussed - - - -   Is the patient's home free of loose throw rugs in walkways, pet beds, electrical cords, etc?   yes      Grab bars in the bathroom? yes      Handrails on the stairs?   yes      Adequate lighting?   yes  Timed Get Up and Go performed: n/a  Depression Screen PHQ 2/9 Scores 01/20/2019 01/18/2018 01/05/2017 01/03/2016  PHQ - 2 Score 0 0 0 0  PHQ- 9 Score 0 0 0 -     Cognitive Function MMSE - Mini Mental State Exam 01/20/2019 01/18/2018 01/05/2017  Orientation to time 5 5 5   Orientation to Place 5 5 5   Registration 3 3 3   Attention/ Calculation 5 0 0  Recall 3 3 3   Language- name 2 objects 0 0 0  Language- repeat 1 1 1   Language- follow 3 step command 0 3 3  Language- read & follow direction 0 0 0  Write a sentence 0 0 0  Copy design 0 0 0  Total score 22 20 20    Mini Cog  Mini-Cog screen was completed. Maximum score is 22. A value of 0 denotes this part of  the MMSE was not completed or the patient failed this part of the Mini-Cog screening.       Immunization History  Administered Date(s) Administered   Influenza,inj,Quad PF,6+ Mos 01/03/2016, 01/12/2017, 01/18/2018   Influenza-Unspecified 02/19/2014   Pneumococcal Conjugate-13 12/18/2013   Pneumococcal Polysaccharide-23 05/11/2005, 12/09/2010   Td 09/07/2008    Qualifies for Shingles Vaccine? yes  Screening Tests Health Maintenance  Topic Date Due   DEXA SCAN  12/04/1995   TETANUS/TDAP  09/08/2018   INFLUENZA VACCINE  12/10/2018   PNA vac Low Risk Adult  Completed    Cancer Screenings: Lung: Low Dose CT Chest recommended if Age 98-80 years, 30 pack-year currently smoking OR have quit w/in 15years. Patient does not qualify. Breast:  Up to date on Mammogram? Yes   Up to date of Bone Density/Dexa? No Colorectal: not required  Additional Screenings: : Hepatitis C Screening: n/a     Plan:    Patient just wants to stay healthy.   I have personally reviewed and noted the following in the patients chart:    Medical and social history  Use of alcohol, tobacco or illicit drugs   Current medications and supplements  Functional ability and status  Nutritional status  Physical activity  Advanced directives  List of other physicians  Hospitalizations, surgeries, and ER visits in previous 12 months  Vitals  Screenings to include cognitive, depression, and falls  Referrals and appointments  In addition, I have reviewed and discussed with patient certain preventive protocols, quality metrics, and best practice recommendations. A written personalized care plan for preventive services as well as general preventive health recommendations were provided to patient.     Kellie Simmering, LPN  01/20/2019 ° ° ° ° °

## 2019-01-20 NOTE — Patient Instructions (Signed)
Katrina Armstrong , Thank you for taking time to come for your Medicare Wellness Visit. I appreciate your ongoing commitment to your health goals. Please review the following plan we discussed and let me know if I can assist you in the future.   Screening recommendations/referrals: Colonoscopy: not required Mammogram: scheduled Bone Density: due Recommended yearly ophthalmology/optometry visit for glaucoma screening and checkup Recommended yearly dental visit for hygiene and checkup  Vaccinations: Influenza vaccine: 01/2018 Pneumococcal vaccine: 12/2013 Tdap vaccine: due Shingles vaccine: discussed    Advanced directives: Please bring a copy of your POA (Power of Attorney) and/or Living Will to your next appointment.    Conditions/risks identified: overweight  Next appointment: 01/24/2019 at 4:00   Preventive Care 83 Years and Older, Female Preventive care refers to lifestyle choices and visits with your health care provider that can promote health and wellness. What does preventive care include?  A yearly physical exam. This is also called an annual well check.  Dental exams once or twice a year.  Routine eye exams. Ask your health care provider how often you should have your eyes checked.  Personal lifestyle choices, including:  Daily care of your teeth and gums.  Regular physical activity.  Eating a healthy diet.  Avoiding tobacco and drug use.  Limiting alcohol use.  Practicing safe sex.  Taking low-dose aspirin every day.  Taking vitamin and mineral supplements as recommended by your health care provider. What happens during an annual well check? The services and screenings done by your health care provider during your annual well check will depend on your age, overall health, lifestyle risk factors, and family history of disease. Counseling  Your health care provider may ask you questions about your:  Alcohol use.  Tobacco use.  Drug use.  Emotional well-being.   Home and relationship well-being.  Sexual activity.  Eating habits.  History of falls.  Memory and ability to understand (cognition).  Work and work Statistician.  Reproductive health. Screening  You may have the following tests or measurements:  Height, weight, and BMI.  Blood pressure.  Lipid and cholesterol levels. These may be checked every 5 years, or more frequently if you are over 43 years old.  Skin check.  Lung cancer screening. You may have this screening every year starting at age 8 if you have a 30-pack-year history of smoking and currently smoke or have quit within the past 15 years.  Fecal occult blood test (FOBT) of the stool. You may have this test every year starting at age 36.  Flexible sigmoidoscopy or colonoscopy. You may have a sigmoidoscopy every 5 years or a colonoscopy every 10 years starting at age 20.  Hepatitis C blood test.  Hepatitis B blood test.  Sexually transmitted disease (STD) testing.  Diabetes screening. This is done by checking your blood sugar (glucose) after you have not eaten for a while (fasting). You may have this done every 1-3 years.  Bone density scan. This is done to screen for osteoporosis. You may have this done starting at age 71.  Mammogram. This may be done every 1-2 years. Talk to your health care provider about how often you should have regular mammograms. Talk with your health care provider about your test results, treatment options, and if necessary, the need for more tests. Vaccines  Your health care provider may recommend certain vaccines, such as:  Influenza vaccine. This is recommended every year.  Tetanus, diphtheria, and acellular pertussis (Tdap, Td) vaccine. You may need a Td  booster every 10 years.  Zoster vaccine. You may need this after age 62.  Pneumococcal 13-valent conjugate (PCV13) vaccine. One dose is recommended after age 74.  Pneumococcal polysaccharide (PPSV23) vaccine. One dose is  recommended after age 77. Talk to your health care provider about which screenings and vaccines you need and how often you need them. This information is not intended to replace advice given to you by your health care provider. Make sure you discuss any questions you have with your health care provider. Document Released: 05/24/2015 Document Revised: 01/15/2016 Document Reviewed: 02/26/2015 Elsevier Interactive Patient Education  2017 [place] Prevention in the Home Falls can cause injuries. They can happen to people of all ages. There are many things you can do to make your home safe and to help prevent falls. What can I do on the outside of my home?  Regularly fix the edges of walkways and driveways and fix any cracks.  Remove anything that might make you trip as you walk through a door, such as a raised step or threshold.  Trim any bushes or trees on the path to your home.  Use bright outdoor lighting.  Clear any walking paths of anything that might make someone trip, such as rocks or tools.  Regularly check to see if handrails are loose or broken. Make sure that both sides of any steps have handrails.  Any raised decks and porches should have guardrails on the edges.  Have any leaves, snow, or ice cleared regularly.  Use sand or salt on walking paths during winter.  Clean up any spills in your garage right away. This includes oil or grease spills. What can I do in the bathroom?  Use night lights.  Install grab bars by the toilet and in the tub and shower. Do not use towel bars as grab bars.  Use non-skid mats or decals in the tub or shower.  If you need to sit down in the shower, use a plastic, non-slip stool.  Keep the floor dry. Clean up any water that spills on the floor as soon as it happens.  Remove soap buildup in the tub or shower regularly.  Attach bath mats securely with double-sided non-slip rug tape.  Do not have throw rugs and other things on  the floor that can make you trip. What can I do in the bedroom?  Use night lights.  Make sure that you have a light by your bed that is easy to reach.  Do not use any sheets or blankets that are too big for your bed. They should not hang down onto the floor.  Have a firm chair that has side arms. You can use this for support while you get dressed.  Do not have throw rugs and other things on the floor that can make you trip. What can I do in the kitchen?  Clean up any spills right away.  Avoid walking on wet floors.  Keep items that you use a lot in easy-to-reach places.  If you need to reach something above you, use a strong step stool that has a grab bar.  Keep electrical cords out of the way.  Do not use floor polish or wax that makes floors slippery. If you must use wax, use non-skid floor wax.  Do not have throw rugs and other things on the floor that can make you trip. What can I do with my stairs?  Do not leave any items on the stairs.  Make sure that there are handrails on both sides of the stairs and use them. Fix handrails that are broken or loose. Make sure that handrails are as long as the stairways.  Check any carpeting to make sure that it is firmly attached to the stairs. Fix any carpet that is loose or worn.  Avoid having throw rugs at the top or bottom of the stairs. If you do have throw rugs, attach them to the floor with carpet tape.  Make sure that you have a light switch at the top of the stairs and the bottom of the stairs. If you do not have them, ask someone to add them for you. What else can I do to help prevent falls?  Wear shoes that:  Do not have high heels.  Have rubber bottoms.  Are comfortable and fit you well.  Are closed at the toe. Do not wear sandals.  If you use a stepladder:  Make sure that it is fully opened. Do not climb a closed stepladder.  Make sure that both sides of the stepladder are locked into place.  Ask someone to  hold it for you, if possible.  Clearly mark and make sure that you can see:  Any grab bars or handrails.  First and last steps.  Where the edge of each step is.  Use tools that help you move around (mobility aids) if they are needed. These include:  Canes.  Walkers.  Scooters.  Crutches.  Turn on the lights when you go into a dark area. Replace any light bulbs as soon as they burn out.  Set up your furniture so you have a clear path. Avoid moving your furniture around.  If any of your floors are uneven, fix them.  If there are any pets around you, be aware of where they are.  Review your medicines with your doctor. Some medicines can make you feel dizzy. This can increase your chance of falling. Ask your doctor what other things that you can do to help prevent falls. This information is not intended to replace advice given to you by your health care provider. Make sure you discuss any questions you have with your health care provider. Document Released: 02/21/2009 Document Revised: 10/03/2015 Document Reviewed: 06/01/2014 Elsevier Interactive Patient Education  2017 [employer].

## 2019-01-20 NOTE — Progress Notes (Signed)
PCP notes:  Health Maintenance:  DEXA, tetanus and flu vaccine are due.  Abnormal Screenings:  None  Patient concerns:  None  Nurse concerns:  None  Next PCP appt.: 01/24/2019 at 4:00

## 2019-01-23 ENCOUNTER — Encounter: Payer: Medicare Other | Admitting: Family Medicine

## 2019-01-24 ENCOUNTER — Encounter: Payer: Self-pay | Admitting: Family Medicine

## 2019-01-24 ENCOUNTER — Other Ambulatory Visit: Payer: Self-pay

## 2019-01-24 ENCOUNTER — Ambulatory Visit (INDEPENDENT_AMBULATORY_CARE_PROVIDER_SITE_OTHER): Payer: Medicare Other | Admitting: Family Medicine

## 2019-01-24 VITALS — BP 136/84 | HR 77 | Temp 98.1°F | Ht 64.0 in | Wt 171.4 lb

## 2019-01-24 DIAGNOSIS — Z7189 Other specified counseling: Secondary | ICD-10-CM

## 2019-01-24 DIAGNOSIS — R413 Other amnesia: Secondary | ICD-10-CM | POA: Diagnosis not present

## 2019-01-24 DIAGNOSIS — Z0000 Encounter for general adult medical examination without abnormal findings: Secondary | ICD-10-CM

## 2019-01-24 MED ORDER — DONEPEZIL HCL 10 MG PO TABS: 10.0000 mg | ORAL_TABLET | Freq: Every day | ORAL | 3 refills | Status: DC

## 2019-01-24 NOTE — Patient Instructions (Addendum)
Flu shot today.  Check with your insurance to see if they will cover the tetanus and shingles shots.  They may be cheaper at the pharmacy.   Update me as needed.  I sent your refill to the mail order pharmacy.  Thanks for your effort.  Take care.  Glad to see you.

## 2019-01-24 NOTE — Progress Notes (Signed)
Pandemic considerations d/w pt.    Memory d/w pt.  Still on donepezil.  She has occ lapses but no red flag events.  Overall, memory not worse per patient report.  She had normal recent brief testing.  We talked about continuing donepezil and she agreed.  No ADE on med.   Health Maintenance:  Colon cancer screening not due.  Pap not due.  Breast cancer screening pending.  DXA declined.  This is reasonable.  Advance directive-son John designated if patient were incapacitated.   Labs d/w pt.    PMH and SH reviewed  ROS: Per HPI unless specifically indicated in ROS section   Meds, vitals, and allergies reviewed.   GEN: nad, alert and oriented HEENT: ncat NECK: supple w/o LA CV: rrr.  PULM: ctab, no inc wob ABD: soft, +bs EXT: no edema SKIN: no acute rash

## 2019-01-25 NOTE — Assessment & Plan Note (Signed)
Colon cancer screening not due.  Pap not due.  Breast cancer screening pending.  DXA declined.  This is reasonable.  Advance directive-son John designated if patient were incapacitated.

## 2019-01-25 NOTE — Assessment & Plan Note (Addendum)
Memory d/w pt.  Still on donepezil.  She has occ lapses but no red flag events.  Overall, memory not worse per patient report.  She had normal recent brief testing.  We talked about continuing donepezil and she agreed.  No ADE on med.  Recheck yearly.  Sooner if needed.  >25 minutes spent in face to face time with patient, >50% spent in counselling or coordination of care.

## 2019-01-25 NOTE — Assessment & Plan Note (Signed)
Advance directive-son John designated if patient were incapacitated. °

## 2019-02-13 LAB — HM MAMMOGRAPHY

## 2019-02-20 ENCOUNTER — Telehealth: Payer: Self-pay

## 2019-02-20 NOTE — Telephone Encounter (Signed)
LMTCB to notified patient that mammogram is negative for malignancy. Abstracted into patient's chart.

## 2019-04-17 ENCOUNTER — Encounter: Payer: Self-pay | Admitting: Family Medicine

## 2019-04-17 ENCOUNTER — Other Ambulatory Visit: Payer: Self-pay

## 2019-04-17 ENCOUNTER — Ambulatory Visit (INDEPENDENT_AMBULATORY_CARE_PROVIDER_SITE_OTHER): Payer: Medicare Other | Admitting: Family Medicine

## 2019-04-17 ENCOUNTER — Telehealth: Payer: Self-pay

## 2019-04-17 VITALS — BP 164/90 | HR 91 | Temp 97.2°F | Ht 64.0 in | Wt 167.6 lb

## 2019-04-17 DIAGNOSIS — H6980 Other specified disorders of Eustachian tube, unspecified ear: Secondary | ICD-10-CM

## 2019-04-17 MED ORDER — FLUTICASONE PROPIONATE 50 MCG/ACT NA SUSP: 2.0000 | Freq: Every day | NASAL | Status: DC

## 2019-04-17 NOTE — Telephone Encounter (Signed)
Patient contacted the office stating that she believes her right ear is infected. Patient states there is no drainage, but her ear is throbbing and is painful. Patient is asking that Dr. Damita Dunnings send in some ear drops for her. I offered an appointment so that she can be evaluated and treated properly, but patient refused.  I advised her that I would send a message to Dr. Damita Dunnings to see what we can do. Please advise.  Thanks!

## 2019-04-17 NOTE — Telephone Encounter (Signed)
Patient advised. Appointment scheduled.  

## 2019-04-17 NOTE — Telephone Encounter (Signed)
I think it would be best to see her at the clinic if at all possible.  There are multiple potential issues here and I need to look in her ear.  Can she please come in?

## 2019-04-17 NOTE — Patient Instructions (Signed)
Once a day, gently try to clear your ears like you did at the visit today to relieve the pressure.   Start using flonase.  You can get that over the counter.  This should get better in a few days.  Update me as needed.  Take care.  Glad to see you.

## 2019-04-17 NOTE — Progress Notes (Signed)
This visit occurred during the SARS-CoV-2 public health emergency.  Safety protocols were in place, including screening questions prior to the visit, additional usage of staff PPE, and extensive cleaning of exam room while observing appropriate contact time as indicated for disinfecting solutions.   BP is usually lower, she was in a rush to get here today.  This is atypical for her.    L ear sx that comes and goes, over the last month.  Sometimes it doesn't hurt at all. No pain yesterday but some pain this AM.  Mild pain now.  No R sided sx.  No FCNAVD.  No drainage from the either ear.  Recently with L ear itching, used peroxide in the canal.   Meds, vitals, and allergies reviewed.   ROS: Per HPI unless specifically indicated in ROS section   nad ncat TM wnl B, no erythema.  She does have left eustachian tube dysfunction noted with minimal movement on Valsalva maneuver. Neck supple.  No lymphadenopathy.

## 2019-04-19 NOTE — Assessment & Plan Note (Signed)
Discussed gently performing Valsalva maneuver at home and using Flonase.  Update me as needed.  She agrees.  Anatomy discussed.

## 2019-04-27 ENCOUNTER — Ambulatory Visit (INDEPENDENT_AMBULATORY_CARE_PROVIDER_SITE_OTHER)
Admission: RE | Admit: 2019-04-27 | Discharge: 2019-04-27 | Disposition: A | Discharge status: Home / Self Care | Payer: Medicare Other | Source: Ambulatory Visit | Attending: Family Medicine | Admitting: Family Medicine

## 2019-04-27 ENCOUNTER — Encounter: Payer: Self-pay | Admitting: Family Medicine

## 2019-04-27 ENCOUNTER — Other Ambulatory Visit: Payer: Self-pay

## 2019-04-27 ENCOUNTER — Ambulatory Visit (INDEPENDENT_AMBULATORY_CARE_PROVIDER_SITE_OTHER): Payer: Medicare Other | Admitting: Family Medicine

## 2019-04-27 ENCOUNTER — Ambulatory Visit: Payer: Medicare Other | Admitting: Family Medicine

## 2019-04-27 VITALS — BP 180/80 | HR 84 | Temp 98.0°F | Ht 64.0 in | Wt 168.5 lb

## 2019-04-27 DIAGNOSIS — M1711 Unilateral primary osteoarthritis, right knee: Secondary | ICD-10-CM

## 2019-04-27 DIAGNOSIS — M1990 Unspecified osteoarthritis, unspecified site: Secondary | ICD-10-CM | POA: Diagnosis not present

## 2019-04-27 DIAGNOSIS — M25561 Pain in right knee: Secondary | ICD-10-CM

## 2019-04-27 MED ORDER — METHYLPREDNISOLONE ACETATE 40 MG/ML IJ SUSP
80.0000 mg | Freq: Once | INTRAMUSCULAR | Status: AC
  Administered 2019-04-27: 12:00:00 80 mg via INTRA_ARTICULAR

## 2019-04-27 NOTE — Progress Notes (Signed)
Katrina Oboyle T. Mikayah Joy, MD Primary Care and [place] at [facility] [name], [address] Phone: [phone]  FAX: [name] - 85 y.o. female  MRN [id number]  Date of Birth: 05-15-1928  Visit Date: 04/27/2019  PCP: Tonia Ghent, MD  Referred by: Tonia Ghent, MD  Chief Complaint  Patient presents with  . Fall    on brick landing at house around 1st of December  . Knee Pain    Right  . Leg Pain    Right    This visit occurred during the SARS-CoV-2 public health emergency.  Safety protocols were in place, including screening questions prior to the visit, additional usage of staff PPE, and extensive cleaning of exam room while observing appropriate contact time as indicated for disinfecting solutions.   Subjective:   Katrina Armstrong is a 85 y.o. very pleasant female patient with Body mass index is 28.92 kg/m. who presents with the following:  Pleasant 85 year old female who presents after falling on a brick landing at her house, date of injury 12/ 1, 2020.  She now is having some focal pain in the knee on the right as well as some right-sided leg pain.  Was going to get on her treadmill  And rotated and the right leg would not come across.  Pulled her leg up.  This started that part and later - about the end of November.  When she fell around December and was coming up her steps and did not quite get her leg up. Both knees hit the landing.  Never saw any bruising. Worse.  No major prior injuries.   Took some motrin and not help.   OA exacerbation  Past Medical History, Surgical History, Social History, Family History, Problem List, Medications, and Allergies have been reviewed and updated if relevant.  Patient Active Problem List   Diagnosis Date Noted  . Constipation 07/06/2018  . Varicose vein of leg 01/23/2018  . Plantar fasciitis of right foot 03/09/2017  . Health care maintenance  01/13/2017  . Trochanteric bursitis of right hip 01/01/2015  . Medicare annual wellness visit, subsequent 12/20/2013  . Advance care planning 12/20/2013  . Benign paroxysmal positional vertigo 05/05/2013  . Dysfunction of eustachian tube 05/05/2013  . Fatigue 08/19/2012  . GERD (gastroesophageal reflux disease) 08/19/2012  . Hyperglycemia 03/28/2012  . Vulvar lesion (Seborrheic keratosis) 07/01/2011  . Shoulder pain 06/05/2011  . Pulmonary embolism ([place]) 12/21/2010  . NSTEMI (non-ST elevated myocardial infarction) ([place]) 12/08/2010  . HLD (hyperlipidemia) 05/19/2010  . Osteoporosis 05/19/2010  . Memory loss 05/19/2010  . DIVERTICULITIS, HX OF 05/19/2010    Past Medical History:  Diagnosis Date  . Alzheimer's dementia ([place])   . GERD (gastroesophageal reflux disease)   . History of cardiovascular stress test 06/05/09   [facility]  Echo- mild AS/mod LVH. Myocardial perfusion imaging is normal. Overall left ventricular function is normal without regional wall motion abnormalities.  . Hyperlipidemia   . Memory loss   . Normal echocardiogram    12/2010, EF 55-60%  . Osteoporosis, unspecified   . Personal history of colonic polyps   . Personal history of other diseases of digestive system   . Pulmonary embolism ([place])    12/2010  . Wears dentures    full upper and lower    Past Surgical History:  Procedure Laterality Date  . APPENDECTOMY    . HEMORROIDECTOMY    . MASS EXCISION  Right 01/31/2016   Procedure: EXCISION MASS RIGHT LOWER LIP;  Surgeon: Beverly Gust, MD;  Location: [facility];  Service: ENT;  Laterality: Right;    Social History   Socioeconomic History  . Marital status: Widowed    Spouse name: Not on file  . Number of children: 1  . Years of education: 11  . Highest education level: Not on file  Occupational History  . 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OTHER    Comment: [employer]  Tobacco Use  . Smoking status: Former Smoker    Quit  date: 05/11/1978    Years since quitting: 40.9  . Smokeless tobacco: Never Used  Substance and Sexual Activity  . Alcohol use: No  . Drug use: No  . Sexual activity: Not Currently  Other Topics Concern  . Not on file  Social History Narrative   All siblings dead, all sisters   Is from [place], [place]   Widowed as of 2011, lives alone   1 son, [name]   Regular exercise: yes, goes to the gym mult days per week.    Enjoys crafts, Development worker, community activity   Social Determinants of Health   Financial Resource Strain: Low Risk   . Difficulty of Paying Living Expenses: Not hard at all  Food Insecurity: No Food Insecurity  . Worried About Charity fundraiser in the Last Year: Never true  . Ran Out of Food in the Last Year: Never true  Transportation Needs: No Transportation Needs  . Lack of Transportation (Medical): No  . Lack of Transportation (Non-Medical): No  Physical Activity: Sufficiently Active  . Days of Exercise per Week: 5 days  . Minutes of Exercise per Session: 60 min  Stress: No Stress Concern Present  . Feeling of Stress : Not at all  Social Connections:   . Frequency of Communication with Friends and Family: Not on file  . Frequency of Social Gatherings with Friends and Family: Not on file  . Attends Religious Services: Not on file  . Active Member of Clubs or Organizations: Not on file  . Attends Archivist Meetings: Not on file  . Marital Status: Not on file  Intimate Partner Violence: Not At Risk  . Fear of Current or Ex-Partner: No  . Emotionally Abused: No  . Physically Abused: No  . Sexually Abused: No    Family History  Problem Relation Age of Onset  . Hypertension Mother   . Dementia Mother   . Melanoma Father   . Hypertension Other        fam hx  . Stroke Other        sibling  . Other Other        PNA- sibling/brain tumor- sibling  . Cancer Sister        brain tumor  . Breast cancer Neg Hx   . Colon cancer Neg Hx     Allergies  Allergen  Reactions  . Boniva [Ibandronate Sodium]     Lack of effect  . Raloxifene     Stopped due to DVT/PE 12/2010    Medication list reviewed and updated in full in [facility].  GEN: No fevers, chills. Nontoxic. Primarily MSK c/o today. MSK: Detailed in the HPI GI: tolerating PO intake without difficulty Neuro: No numbness, parasthesias, or tingling associated. Otherwise the pertinent positives of the ROS are noted above.   Objective:   BP (!) 180/80   Pulse 84   Temp 98 F (  36.7 C) (Temporal)   Ht 5\' 4"  (1.626 m)   Wt 168 lb 8 oz (76.4 kg)   SpO2 97%   BMI 28.92 kg/m    GEN: WDWN, NAD, Non-toxic, Alert & Oriented x 3 HEENT: Atraumatic, Normocephalic.  Ears and Nose: No external deformity. EXTR: No clubbing/cyanosis/edema NEURO: antalgic gait  PSYCH: Normally interactive. Conversant. Not depressed or anxious appearing.  Calm demeanor.    The patient was examined seated secondary to pain and fragility.  There is no appreciable effusion.  Quadriceps tendon and patellar tendon are intact.  Tibia and fibula are nontender and throughout including the tibial plateau.  Stable to varus and valgus stress.  ACL and PCL are intact.  She does have pain with flexion to 100 degrees and there is active crepitus.  Seated McMurray's is negative for mechanical symptoms with some pain.  Radiology: No results found.   Assessment and Plan:     ICD-10-CM   1. Exacerbation of osteoarthritis  M19.90   2. Acute pain of right knee  M25.561 XR WB Knee 4 Views W/Patella Right  3. Primary osteoarthritis of right knee  M17.11 methylPREDNISolone acetate (DEPO-MEDROL) injection 80 mg   X-rays: AP Bilateral Weight-bearing, Weightbearing Lateral, Sunrise views Indication: knee pain Findings: Patient has moderate to severe osteoarthritis tricompartmentally with end-stage bone-on-bone pathology in the lateral compartment.  There is no fracture or dislocation. Electronically Signed  By: Owens Loffler,  MD On: 04/27/2019 10:20 AM EST   Arthritis exacerbation, failure of conservative measurement including rest and anti-inflammatories.  Intra-articular shoulder injection with progression of return to exercise on Sunday or Monday.  Aspiration/Injection Procedure Note SHERONE PEDIGO October 22, 1928 Date of procedure: 04/27/2019  Procedure: Large Joint Aspiration / Injection of Knee, R Indications: Pain  Procedure Details Patient verbally consented to procedure. Risks (including potential rare risk of infection), benefits, and alternatives explained. Sterilely prepped with Chloraprep. Ethyl cholride used for anesthesia. 8 cc Lidocaine 1% mixed with 2 mL Depo-Medrol 40 mg injected using the anteromedial approach without difficulty. No complications with procedure and tolerated well. Patient had decreased pain post-injection. Medication: 2 mL of Depo-Medrol 40 mg, equaling Depo-Medrol 80 mg total   Follow-up: No follow-ups on file.  Meds ordered this encounter  Medications  . methylPREDNISolone acetate (DEPO-MEDROL) injection 80 mg   Orders Placed This Encounter  Procedures  . XR WB Knee 4 Views W/Patella Right    Signed,  Annaston Upham T. Alleen Kehm, MD   Outpatient Encounter Medications as of 04/27/2019  Medication Sig  . Calcium Carb-Cholecalciferol 600-400 MG-UNIT TABS Take 1 tablet by mouth daily.   . Cholecalciferol (VITAMIN D) 2000 UNITS CAPS Take 1 capsule by mouth daily.  . Cyanocobalamin (B-12 PO) Take 1 tablet by mouth daily.  Marland Kitchen docusate sodium (STOOL SOFTENER) 100 MG capsule Take 100 mg by mouth as needed.   . donepezil (ARICEPT) 10 MG tablet Take 1 tablet (10 mg total) by mouth daily.  . fluticasone (FLONASE) 50 MCG/ACT nasal spray Place 2 sprays into both nostrils daily.  . Multiple Vitamin (MULTIVITAMIN WITH MINERALS) TABS Take 1 tablet by mouth daily.  . polyethylene glycol powder (GLYCOLAX/MIRALAX) powder Take 17 g by mouth daily as needed for mild constipation.  . [EXPIRED]  methylPREDNISolone acetate (DEPO-MEDROL) injection 80 mg    No facility-administered encounter medications on file as of 04/27/2019.

## 2019-04-27 NOTE — Patient Instructions (Signed)
After you have an injection of any joint, the numbing medicine will make it feel better for a few hours. Later tonight, it is common for the joint to feel worse. The steroid will take 48-72 hours to start working - it is the thing that will likely provide the most relief.  Ice the joint where you had the injection at least 2-3 times a day for 20 minutes for 3 days. If have had swelling, pain in the joint itself, ice for 1 week.  You can use an ice bag, frozen peas or corn, an ice pack - all work

## 2019-06-16 ENCOUNTER — Other Ambulatory Visit: Payer: Self-pay | Admitting: *Deleted

## 2019-06-16 ENCOUNTER — Telehealth: Payer: Self-pay | Admitting: Family Medicine

## 2019-06-16 MED ORDER — DONEPEZIL HCL 10 MG PO TABS: 10.0000 mg | ORAL_TABLET | Freq: Every day | ORAL | 2 refills | Status: DC

## 2019-06-16 NOTE — Telephone Encounter (Signed)
Patient called about getting a new script  She stated she is needing the donepezil 10mg  script sent in   [facility]   Patient stated she is not able to call them for any refills

## 2019-06-16 NOTE — Telephone Encounter (Signed)
[facility] 

## 2019-06-23 ENCOUNTER — Other Ambulatory Visit: Payer: Self-pay

## 2019-06-23 MED ORDER — DONEPEZIL HCL 10 MG PO TABS: 10.0000 mg | ORAL_TABLET | Freq: Every day | ORAL | 2 refills | Status: DC

## 2019-06-23 NOTE — Telephone Encounter (Signed)
Patient stated that she received a letter from [facility] that they could not accept the refill.  Patient stated that a local [facility] stated if we would sent the refill to them they would fill it [facility] her.   [facility]  Patient would like a call back when this is sent to the pharmacy

## 2019-06-23 NOTE — Telephone Encounter (Signed)
[facility] [address] [facility] [place].  I left detailed message on voicemail letting patient know that new rx has been sent in.

## 2019-07-21 ENCOUNTER — Telehealth: Payer: Self-pay

## 2019-07-21 NOTE — Telephone Encounter (Signed)
LVM w front door info 3.21.2021 TLJ

## 2019-07-24 ENCOUNTER — Other Ambulatory Visit: Payer: Self-pay | Admitting: Family Medicine

## 2019-07-24 ENCOUNTER — Other Ambulatory Visit: Payer: Self-pay

## 2019-07-24 ENCOUNTER — Other Ambulatory Visit (INDEPENDENT_AMBULATORY_CARE_PROVIDER_SITE_OTHER): Payer: Medicare Other

## 2019-07-24 DIAGNOSIS — R739 Hyperglycemia, unspecified: Secondary | ICD-10-CM

## 2019-07-24 LAB — BASIC METABOLIC PANEL
BUN: 19 mg/dL (ref 6–23)
CO2: 29 mEq/L (ref 19–32)
Calcium: 9.8 mg/dL (ref 8.4–10.5)
Chloride: 99 mEq/L (ref 96–112)
Creatinine, Ser: 0.92 mg/dL (ref 0.40–1.20)
GFR: 57.53 mL/min — ABNORMAL LOW (ref >60.00)
Glucose, Bld: 106 mg/dL — ABNORMAL HIGH (ref 70–99)
Potassium: 3.9 mEq/L (ref 3.5–5.1)
Sodium: 137 mEq/L (ref 135–145)

## 2019-07-24 LAB — HEMOGLOBIN A1C: Hgb A1c MFr Bld: 5.8 % (ref 4.6–6.5)

## 2019-07-27 ENCOUNTER — Ambulatory Visit: Payer: Medicare Other | Admitting: Family Medicine

## 2019-07-31 ENCOUNTER — Ambulatory Visit: Payer: Medicare Other | Admitting: Family Medicine

## 2019-07-31 ENCOUNTER — Other Ambulatory Visit: Payer: Self-pay

## 2019-07-31 ENCOUNTER — Encounter: Payer: Self-pay | Admitting: Family Medicine

## 2019-07-31 DIAGNOSIS — R413 Other amnesia: Secondary | ICD-10-CM

## 2019-07-31 DIAGNOSIS — R030 Elevated blood-pressure reading, without diagnosis of hypertension: Secondary | ICD-10-CM | POA: Diagnosis not present

## 2019-07-31 NOTE — Progress Notes (Signed)
This visit occurred during the SARS-CoV-2 public health emergency.  Safety protocols were in place, including screening questions prior to the visit, additional usage of staff PPE, and extensive cleaning of exam room while observing appropriate contact time as indicated for disinfecting solutions.  Elevated BP.  No meds.   Using medication without problems or lightheadedness: no Chest pain with exertion:no Edema:no Short of breath:no Average home BPs: not checked at home.   She was worried about coming to the appointment this AM and that may have affected her BP.  D/w pt.    Memory hx d/w pt.  Still on donepezil.  She is still living alone. She is managing on her own.  Her son is nearby.  She has some slowing of recall. She has some help with yardwork since she is [place] acres.  Compliant with medication.  She isn't leaving the stove on.  She isn't cooking much.  She is eating a packaged breakfast with meals later in the day.  Her son helps with meals.  This is her longstanding baseline.  She is still getting her groceries.  She is still going to the gym 5 days a week.  She is driving locally.  She can still give directions from her home to her church.  She is in the midst of reading a 400+ page book which she brought to clinic.  She wanted to know how she could decrease her overall list of medications we talked about tapering her med list slightly.  See orders.  Meds, vitals, and allergies reviewed.   ROS: Per HPI unless specifically indicated in ROS section   GEN: nad, alert and oriented HEENT: ncat NECK: supple w/o LA CV: rrr. PULM: ctab, no inc wob ABD: soft, +bs EXT: no edema SKIN: no acute rash  Alert and oriented to year month and day.  3 out of 3 attention.  2 out of 3 recall.  She can read a watch and do math.

## 2019-07-31 NOTE — Patient Instructions (Signed)
Check your BP a few times out of clinic and update me if persistently elevated.  It is up a little on recheck but better than when you came in.   If you or you son notice more changes in the meantime then let me know.   I tapered your med list.  Take donepezil at night.  Plan on recheck at a yearly visit in about 6 months.  Take care.  Glad to see you.

## 2019-08-02 DIAGNOSIS — R030 Elevated blood-pressure reading, without diagnosis of hypertension: Secondary | ICD-10-CM | POA: Insufficient documentation

## 2019-08-02 NOTE — Assessment & Plan Note (Signed)
Reasonable to continue as is for now.  No red flag symptoms.  She will update me as needed.

## 2019-08-02 NOTE — Assessment & Plan Note (Signed)
I want her to check her BP a few times out of clinic and update me if persistently elevated.  It improved on recheck but was not all the way to goal.  I do not want to induce hypotension.  She agrees.

## 2019-08-08 ENCOUNTER — Telehealth: Payer: Self-pay | Admitting: Family Medicine

## 2019-08-08 MED ORDER — AMLODIPINE BESYLATE 5 MG PO TABS: 5.0000 mg | ORAL_TABLET | Freq: Every day | ORAL | 3 refills | Status: DC

## 2019-08-08 NOTE — Telephone Encounter (Signed)
Left detailed message on voicemail. DPR 

## 2019-08-08 NOTE — Telephone Encounter (Signed)
Lynn,NP [employer] called with patient's blood pressure reading from visit today 200/100, 200/92 on left arm. 210/100-right arm.

## 2019-08-08 NOTE — Telephone Encounter (Signed)
I would start treatment given the elevated BP.  rx sent.  Have her update me about BP in a few days.  If BP or SOB, to ER.  Thanks.

## 2020-01-21 ENCOUNTER — Other Ambulatory Visit: Payer: Self-pay | Admitting: Family Medicine

## 2020-01-21 DIAGNOSIS — R739 Hyperglycemia, unspecified: Secondary | ICD-10-CM

## 2020-01-21 DIAGNOSIS — M810 Age-related osteoporosis without current pathological fracture: Secondary | ICD-10-CM

## 2020-01-21 DIAGNOSIS — I10 Essential (primary) hypertension: Secondary | ICD-10-CM

## 2020-01-22 ENCOUNTER — Ambulatory Visit: Payer: Medicare Other

## 2020-01-23 ENCOUNTER — Ambulatory Visit (INDEPENDENT_AMBULATORY_CARE_PROVIDER_SITE_OTHER): Payer: Medicare Other

## 2020-01-23 ENCOUNTER — Other Ambulatory Visit: Payer: Self-pay

## 2020-01-23 ENCOUNTER — Other Ambulatory Visit (INDEPENDENT_AMBULATORY_CARE_PROVIDER_SITE_OTHER): Payer: Medicare Other

## 2020-01-23 DIAGNOSIS — R739 Hyperglycemia, unspecified: Secondary | ICD-10-CM

## 2020-01-23 DIAGNOSIS — I10 Essential (primary) hypertension: Secondary | ICD-10-CM

## 2020-01-23 DIAGNOSIS — M810 Age-related osteoporosis without current pathological fracture: Secondary | ICD-10-CM

## 2020-01-23 DIAGNOSIS — Z0000 Encounter for general adult medical examination without abnormal findings: Secondary | ICD-10-CM | POA: Diagnosis not present

## 2020-01-23 LAB — COMPREHENSIVE METABOLIC PANEL
ALT: 16 U/L (ref 0–35)
AST: 23 U/L (ref 0–37)
Albumin: 4.1 g/dL (ref 3.5–5.2)
Alkaline Phosphatase: 55 U/L (ref 39–117)
BUN: 19 mg/dL (ref 6–23)
CO2: 31 mEq/L (ref 19–32)
Calcium: 9.6 mg/dL (ref 8.4–10.5)
Chloride: 100 mEq/L (ref 96–112)
Creatinine, Ser: 0.99 mg/dL (ref 0.40–1.20)
GFR: 52.8 mL/min — ABNORMAL LOW (ref >60.00)
Glucose, Bld: 103 mg/dL — ABNORMAL HIGH (ref 70–99)
Potassium: 3.7 mEq/L (ref 3.5–5.1)
Sodium: 139 mEq/L (ref 135–145)
Total Bilirubin: 0.5 mg/dL (ref 0.2–1.2)
Total Protein: 7.1 g/dL (ref 6.0–8.3)

## 2020-01-23 LAB — CBC WITH DIFFERENTIAL/PLATELET
Basophils Absolute: 0.1 10*3/uL (ref 0.0–0.1)
Basophils Relative: 0.6 % (ref 0.0–3.0)
Eosinophils Absolute: 0.1 10*3/uL (ref 0.0–0.7)
Eosinophils Relative: 1.2 % (ref 0.0–5.0)
HCT: 41.4 % (ref 36.0–46.0)
Hemoglobin: 13.6 g/dL (ref 12.0–15.0)
Lymphocytes Relative: 40 % (ref 12.0–46.0)
Lymphs Abs: 4.7 10*3/uL — ABNORMAL HIGH (ref 0.7–4.0)
MCHC: 32.8 g/dL (ref 30.0–36.0)
MCV: 87.2 fl (ref 78.0–100.0)
Monocytes Absolute: 0.7 10*3/uL (ref 0.1–1.0)
Monocytes Relative: 5.9 % (ref 3.0–12.0)
Neutro Abs: 6.1 10*3/uL (ref 1.4–7.7)
Neutrophils Relative %: 52.3 % (ref 43.0–77.0)
Platelets: 245 10*3/uL (ref 150.0–400.0)
RBC: 4.74 Mil/uL (ref 3.87–5.11)
RDW: 15.7 % — ABNORMAL HIGH (ref 11.5–15.5)
WBC: 11.7 10*3/uL — ABNORMAL HIGH (ref 4.0–10.5)

## 2020-01-23 LAB — LIPID PANEL
Cholesterol: 193 mg/dL (ref 0–200)
HDL: 50.2 mg/dL (ref >39.00)
LDL Cholesterol: 126 mg/dL — ABNORMAL HIGH (ref 0–99)
NonHDL: 142.88
Total CHOL/HDL Ratio: 4
Triglycerides: 86 mg/dL (ref 0.0–149.0)
VLDL: 17.2 mg/dL (ref 0.0–40.0)

## 2020-01-23 LAB — VITAMIN D 25 HYDROXY (VIT D DEFICIENCY, FRACTURES): VITD: 64.51 ng/mL (ref 30.00–100.00)

## 2020-01-23 LAB — HEMOGLOBIN A1C: Hgb A1c MFr Bld: 6 % (ref 4.6–6.5)

## 2020-01-23 NOTE — Progress Notes (Signed)
PCP notes:  Health Maintenance: Flu- due Covid- declined Dexa- declined   Abnormal Screenings: none   Patient concerns: none   Nurse concerns: none   Next PCP appt.: 01/26/2020 @ 8:30 am

## 2020-01-23 NOTE — Progress Notes (Signed)
Subjective:   Katrina Armstrong is a 86 y.o. female who presents for Medicare Annual (Subsequent) preventive examination.  Review of Systems: N/A      I connected with the patient today by telephone and verified that I am speaking with the correct person using two identifiers. Location patient: home Location nurse: work Persons participating in the telephone visit: patient, nurse.   I discussed the limitations, risks, security and privacy concerns of performing an evaluation and management service by telephone and the availability of in person appointments. I also discussed with the patient that there may be a patient responsible charge related to this service. The patient expressed understanding and verbally consented to this telephonic visit.        Cardiac Risk Factors include: advanced age (>46men, >61 women);dyslipidemia     Objective:    Today's Vitals   There is no height or weight on file to calculate BMI.  Advanced Directives 01/23/2020 01/20/2019 01/18/2018 01/05/2017 01/31/2016  Does Patient Have a Medical Advance Directive? Yes Yes No Yes Yes  Type of Paramedic of [place];Living will [place];Living will - Healthcare Power of [place] will  [place] in Chart? No - copy requested No - copy requested - No - copy requested No - copy requested  Would patient like information on creating a medical advance directive? - - No - Patient declined - -    Current Medications (verified) Outpatient Encounter Medications as of 01/23/2020  Medication Sig   amLODipine (NORVASC) 5 MG tablet Take 1 tablet (5 mg total) by mouth daily.   Calcium Carb-Cholecalciferol 600-400 MG-UNIT TABS Take 1 tablet by mouth daily.    Cholecalciferol (VITAMIN D) 2000 UNITS CAPS Take 1 capsule by mouth daily.   cyanocobalamin 1000 MCG tablet Take 1,000 mcg by mouth daily.   docusate sodium (STOOL SOFTENER) 100 MG capsule  Take 100 mg by mouth as needed.    donepezil (ARICEPT) 10 MG tablet Take 1 tablet (10 mg total) by mouth daily.   fluticasone (FLONASE) 50 MCG/ACT nasal spray Place 2 sprays into both nostrils daily.   ibuprofen (ADVIL) 200 MG tablet Take 200 mg by mouth every 6 (six) hours as needed.   Nutritional Supplements (CARNATION INSTANT BREAKFAST) LIQD Take by mouth 3 (three) times daily.   polyethylene glycol powder (GLYCOLAX/MIRALAX) powder Take 17 g by mouth daily as needed for mild constipation.   No facility-administered encounter medications on file as of 01/23/2020.    Allergies (verified) Boniva [ibandronate sodium] and Raloxifene   History: Past Medical History:  Diagnosis Date   Alzheimer's dementia ([place])    GERD (gastroesophageal reflux disease)    History of cardiovascular stress test 06/05/09   [facility]  Echo- mild AS/mod LVH. Myocardial perfusion imaging is normal. Overall left ventricular function is normal without regional wall motion abnormalities.   Hyperlipidemia    Memory loss    Normal echocardiogram    12/2010, EF 55-60%   Osteoporosis, unspecified    Personal history of colonic polyps    Personal history of other diseases of digestive system    Pulmonary embolism ([place])    12/2010   Wears dentures    full upper and lower   Past Surgical History:  Procedure Laterality Date   APPENDECTOMY     HEMORROIDECTOMY     MASS EXCISION Right 01/31/2016   Procedure: EXCISION MASS RIGHT LOWER LIP;  Surgeon: Beverly Gust, MD;  Location: [facility];  Service: ENT;  Laterality: Right;   Family History  Problem Relation Age of Onset   Hypertension Mother    Dementia Mother    Melanoma Father    Hypertension Other        fam hx   Stroke Other        sibling   Other Other        PNA- sibling/brain tumor- sibling   Cancer Sister        brain tumor   Breast cancer Neg Hx    Colon cancer Neg Hx    Social History   Socioeconomic  History   Marital status: Widowed    Spouse name: Not on file   Number of children: 1   Years of education: 59   Highest education level: Not on file  Occupational History   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[employer]: [employer]  Tobacco Use   Smoking status: Former Smoker    Quit date: 05/11/1978    Years since quitting: 41.7   Smokeless tobacco: Never Used  Vaping Use   Vaping Use: Never used  Substance and Sexual Activity   Alcohol use: No   Drug use: No   Sexual activity: Not Currently  Other Topics Concern   Not on file  Social History Narrative   All siblings dead, all sisters   Is from [place], [place]   Widowed as of 2011, lives alone   1 son, [name]   Regular exercise: yes, goes to the gym mult days per week.    Enjoys crafts, Development worker, community activity   Social Determinants of Radio broadcast assistant Strain: Low Risk    Difficulty of Paying Living Expenses: Not hard at all  Food Insecurity: No Food Insecurity   Worried About Charity fundraiser in the Last Year: Never true   Arboriculturist in the Last Year: Never true  Transportation Needs: No Transportation Needs   Lack of Transportation (Medical): No   Lack of Transportation (Non-Medical): No  Physical Activity: Sufficiently Active   Days of Exercise per Week: 5 days   Minutes of Exercise per Session: 30 min  Stress: No Stress Concern Present   Feeling of Stress : Not at all  Social Connections:    Frequency of Communication with Friends and Family: Not on file   Frequency of Social Gatherings with Friends and Family: Not on file   Attends Religious Services: Not on Electrical engineer or Organizations: Not on file   Attends Archivist Meetings: Not on file   Marital Status: Not on file    Tobacco Counseling Counseling given: Not Answered   Clinical Intake:  Pre-visit preparation completed: Yes  Pain : No/denies pain     Nutritional  Risks: None Diabetes: No  How often do you need to have someone help you when you read instructions, pamphlets, or other written materials from your doctor or pharmacy?: 1 - Never What is the last grade level you completed in school?: 12th  Diabetic: No Nutrition Risk Assessment:  Has the patient had any N/V/D within the last 2 months?  No  Does the patient have any non-healing wounds?  No  Has the patient had any unintentional weight loss or weight gain?  No   Diabetes:  Is the patient diabetic?  No  If diabetic, was a CBG obtained today?  N/A Did the patient bring in their glucometer  from home?  N/A How often do you monitor your CBG's? N/A.   Financial Strains and Diabetes Management:  Are you having any financial strains with the device, your supplies or your medication? N/A.  Does the patient want to be seen by Chronic Care Management for management of their diabetes?  N/A Would the patient like to be referred to a Nutritionist or for Diabetic Management?  N/A     Interpreter Needed?: No  Information entered by :: CJohnson, LPN   Activities of Daily Living In your present state of health, do you have any difficulty performing the following activities: 01/23/2020  Hearing? N  Vision? N  Difficulty concentrating or making decisions? Y  Comment Patient has trouble remembering sometimes  Walking or climbing stairs? N  Dressing or bathing? N  Doing errands, shopping? N  Preparing Food and eating ? N  Using the Toilet? N  In the past six months, have you accidently leaked urine? Y  Comment sometimes  Do you have problems with loss of bowel control? N  Managing your Medications? N  Managing your Finances? N  Housekeeping or managing your Housekeeping? N  Some recent data might be hidden    Patient Care Team: Tonia Ghent, MD as PCP - General  Indicate any recent Medical Services you may have received from other than [facility] providers in the past year (date may be  approximate).     Assessment:   This is a routine wellness examination for [place].  Hearing/Vision screen  Hearing Screening   125Hz  250Hz  500Hz  1000Hz  2000Hz  3000Hz  4000Hz  6000Hz  8000Hz   Right ear:           Left ear:           Vision Screening Comments: Patient gets annual eye exams  Dietary issues and exercise activities discussed: Current Exercise Habits: Home exercise routine, Type of exercise: treadmill, Time (Minutes): 30, Frequency (Times/Week): 5, Weekly Exercise (Minutes/Week): 150, Intensity: Moderate, Exercise limited by: None identified  Goals     Increase physical activity     Starting 01/18/2018, I will continue to exercise for 45-60 min 5 days per week.      Patient Stated     01/20/2019, wants to stay healthy     Patient Stated     01/23/2020, I will continue to walk on the treadmill 5 days a week for 30 minutes.       Depression Screen PHQ 2/9 Scores 01/23/2020 01/20/2019 01/18/2018 01/05/2017 01/03/2016 12/31/2014 12/18/2013  PHQ - 2 Score 0 0 0 0 0 0 0  PHQ- 9 Score 0 0 0 0 - - -    Fall Risk Fall Risk  01/23/2020 01/20/2019 01/18/2018 01/05/2017 01/03/2016  Falls in the past year? 1 0 No No No  Comment tripped on stairs - - - -  Number falls in past yr: 0 - - - -  Injury with Fall? 0 - - - -  Risk for fall due to : Medication side effect;History of fall(s) - - - -  Follow up Falls evaluation completed;Falls prevention discussed Falls evaluation completed;Falls prevention discussed - - -    Any stairs in or around the home? Yes  If so, are there any without handrails? No  Home free of loose throw rugs in walkways, pet beds, electrical cords, etc? Yes  Adequate lighting in your home to reduce risk of falls? Yes   ASSISTIVE DEVICES UTILIZED TO PREVENT FALLS:  Life alert? No  Use of a cane,  walker or w/c? No  Grab bars in the bathroom? No  Shower chair or bench in shower? No  Elevated toilet seat or a handicapped toilet? No   TIMED UP AND GO:  Was the  test performed? N/A, telephonic visit.   Cognitive Function: MMSE - Mini Mental State Exam 01/23/2020 01/20/2019 01/18/2018 01/05/2017  Orientation to time 5 5 5 5   Orientation to Place 5 5 5 5   Registration 3 3 3 3   Attention/ Calculation 5 5 0 0  Recall 3 3 3 3   Language- name 2 objects - 0 0 0  Language- repeat 1 1 1 1   Language- follow 3 step command - 0 3 3  Language- read & follow direction - 0 0 0  Write a sentence - 0 0 0  Copy design - 0 0 0  Total score - 22 20 20   Mini Cog  Mini-Cog screen was completed. Maximum score is 22. A value of 0 denotes this part of the MMSE was not completed or the patient failed this part of the Mini-Cog screening.       Immunizations Immunization History  Administered Date(s) Administered   Influenza,inj,Quad PF,6+ Mos 01/03/2016, 01/12/2017, 01/18/2018   Influenza-Unspecified 02/19/2014   Pneumococcal Conjugate-13 12/18/2013   Pneumococcal Polysaccharide-23 05/11/2005, 12/09/2010   Td 09/07/2008   Tdap 02/01/2019   Zoster Recombinat (Shingrix) 01/26/2019, 04/16/2019    TDAP status: Up to date Flu Vaccine status: due, will complete at office visit  Pneumococcal vaccine status: Up to date Covid-19 vaccine status: Declined, Education has been provided regarding the importance of this vaccine but patient still declined. Advised may receive this vaccine at local pharmacy or Health Dept.or vaccine clinic. Aware to provide a copy of the vaccination record if obtained from local pharmacy or Health Dept. Verbalized acceptance and understanding.  Qualifies for Shingles Vaccine? Yes   Zostavax completed No   Shingrix Completed?: Yes  Screening Tests Health Maintenance  Topic Date Due   INFLUENZA VACCINE  12/10/2019   COVID-19 Vaccine (1) 02/07/2021 (Originally 12/04/1940)   DEXA SCAN  01/22/2025 (Originally 12/04/1995)   TETANUS/TDAP  01/31/2029   PNA vac Low Risk Adult  Completed    Health Maintenance  Health Maintenance Due   Topic Date Due   INFLUENZA VACCINE  12/10/2019    Colorectal cancer screening: No longer required.  Mammogram status: Completed 02/13/2019. Repeat every year Bone Density status: declined  Lung Cancer Screening: (Low Dose CT Chest recommended if Age 94-80 years, 30 pack-year currently smoking OR have quit w/in 15years.) does not qualify.   Additional Screening:  Hepatitis C Screening: does not qualify; Completed N/A  Vision Screening: Recommended annual ophthalmology exams for early detection of glaucoma and other disorders of the eye. Is the patient up to date with their annual eye exam?  Yes  Who is the provider or what is the name of the office in which the patient attends annual eye exams? [facility] in [place] If pt is not established with a provider, would they like to be referred to a provider to establish care? No .   Dental Screening: Recommended annual dental exams for proper oral hygiene  Community Resource Referral / Chronic Care Management: CRR required this visit?  No   CCM required this visit?  No      Plan:     I have personally reviewed and noted the following in the patients chart:    Medical and social history  Use of alcohol, tobacco or  illicit drugs   Current medications and supplements  Functional ability and status  Nutritional status  Physical activity  Advanced directives  List of other physicians  Hospitalizations, surgeries, and ER visits in previous 12 months  Vitals  Screenings to include cognitive, depression, and falls  Referrals and appointments  In addition, I have reviewed and discussed with patient certain preventive protocols, quality metrics, and best practice recommendations. A written personalized care plan for preventive services as well as general preventive health recommendations were provided to patient.   Due to this being a telephonic visit, the after visit summary with patients personalized plan was  offered to patient via mail or my-chart.  Patient preferred to pick up at office at next visit.   Andrez Grime, LPN   01/18/2889

## 2020-01-23 NOTE — Patient Instructions (Signed)
Ms. Katrina Armstrong , Thank you for taking time to come for your Medicare Wellness Visit. I appreciate your ongoing commitment to your health goals. Please review the following plan we discussed and let me know if I can assist you in the future.   Screening recommendations/referrals: Colonoscopy: no longer required Mammogram: Up to date, completed 02/13/2019, due 02/2020 Bone Density: declined Recommended yearly ophthalmology/optometry visit for glaucoma screening and checkup Recommended yearly dental visit for hygiene and checkup  Vaccinations: Influenza vaccine: due, will get at office visit Pneumococcal vaccine: Completed series Tdap vaccine: Up to date, completed 02/01/2019, due 01/2029 Shingles vaccine: Completed series   Covid-19:declined  Advanced directives: Please bring a copy of your POA (Power of Attorney) and/or Living Will to your next appointment.    Conditions/risks identified: hyperlipidemia  Next appointment: Follow up in one year for your annual wellness visit    Preventive Care 65 Years and Older, Female Preventive care refers to lifestyle choices and visits with your health care provider that can promote health and wellness. What does preventive care include?  A yearly physical exam. This is also called an annual well check.  Dental exams once or twice a year.  Routine eye exams. Ask your health care provider how often you should have your eyes checked.  Personal lifestyle choices, including:  Daily care of your teeth and gums.  Regular physical activity.  Eating a healthy diet.  Avoiding tobacco and drug use.  Limiting alcohol use.  Practicing safe sex.  Taking low-dose aspirin every day.  Taking vitamin and mineral supplements as recommended by your health care provider. What happens during an annual well check? The services and screenings done by your health care provider during your annual well check will depend on your age, overall health, lifestyle  risk factors, and family history of disease. Counseling  Your health care provider may ask you questions about your:  Alcohol use.  Tobacco use.  Drug use.  Emotional well-being.  Home and relationship well-being.  Sexual activity.  Eating habits.  History of falls.  Memory and ability to understand (cognition).  Work and work Statistician.  Reproductive health. Screening  You may have the following tests or measurements:  Height, weight, and BMI.  Blood pressure.  Lipid and cholesterol levels. These may be checked every 5 years, or more frequently if you are over 20 years old.  Skin check.  Lung cancer screening. You may have this screening every year starting at age 86 if you have a 30-pack-year history of smoking and currently smoke or have quit within the past 15 years.  Fecal occult blood test (FOBT) of the stool. You may have this test every year starting at age 31.  Flexible sigmoidoscopy or colonoscopy. You may have a sigmoidoscopy every 5 years or a colonoscopy every 10 years starting at age 31.  Hepatitis C blood test.  Hepatitis B blood test.  Sexually transmitted disease (STD) testing.  Diabetes screening. This is done by checking your blood sugar (glucose) after you have not eaten for a while (fasting). You may have this done every 1-3 years.  Bone density scan. This is done to screen for osteoporosis. You may have this done starting at age 97.  Mammogram. This may be done every 1-2 years. Talk to your health care provider about how often you should have regular mammograms. Talk with your health care provider about your test results, treatment options, and if necessary, the need for more tests. Vaccines  Your health care  provider may recommend certain vaccines, such as:  Influenza vaccine. This is recommended every year.  Tetanus, diphtheria, and acellular pertussis (Tdap, Td) vaccine. You may need a Td booster every 10 years.  Zoster vaccine.  You may need this after age 77.  Pneumococcal 13-valent conjugate (PCV13) vaccine. One dose is recommended after age 46.  Pneumococcal polysaccharide (PPSV23) vaccine. One dose is recommended after age 21. Talk to your health care provider about which screenings and vaccines you need and how often you need them. This information is not intended to replace advice given to you by your health care provider. Make sure you discuss any questions you have with your health care provider. Document Released: 05/24/2015 Document Revised: 01/15/2016 Document Reviewed: 02/26/2015 Elsevier Interactive Patient Education  2017 [place] Prevention in the Home Falls can cause injuries. They can happen to people of all ages. There are many things you can do to make your home safe and to help prevent falls. What can I do on the outside of my home?  Regularly fix the edges of walkways and driveways and fix any cracks.  Remove anything that might make you trip as you walk through a door, such as a raised step or threshold.  Trim any bushes or trees on the path to your home.  Use bright outdoor lighting.  Clear any walking paths of anything that might make someone trip, such as rocks or tools.  Regularly check to see if handrails are loose or broken. Make sure that both sides of any steps have handrails.  Any raised decks and porches should have guardrails on the edges.  Have any leaves, snow, or ice cleared regularly.  Use sand or salt on walking paths during winter.  Clean up any spills in your garage right away. This includes oil or grease spills. What can I do in the bathroom?  Use night lights.  Install grab bars by the toilet and in the tub and shower. Do not use towel bars as grab bars.  Use non-skid mats or decals in the tub or shower.  If you need to sit down in the shower, use a plastic, non-slip stool.  Keep the floor dry. Clean up any water that spills on the floor as soon  as it happens.  Remove soap buildup in the tub or shower regularly.  Attach bath mats securely with double-sided non-slip rug tape.  Do not have throw rugs and other things on the floor that can make you trip. What can I do in the bedroom?  Use night lights.  Make sure that you have a light by your bed that is easy to reach.  Do not use any sheets or blankets that are too big for your bed. They should not hang down onto the floor.  Have a firm chair that has side arms. You can use this for support while you get dressed.  Do not have throw rugs and other things on the floor that can make you trip. What can I do in the kitchen?  Clean up any spills right away.  Avoid walking on wet floors.  Keep items that you use a lot in easy-to-reach places.  If you need to reach something above you, use a strong step stool that has a grab bar.  Keep electrical cords out of the way.  Do not use floor polish or wax that makes floors slippery. If you must use wax, use non-skid floor wax.  Do not have throw  rugs and other things on the floor that can make you trip. What can I do with my stairs?  Do not leave any items on the stairs.  Make sure that there are handrails on both sides of the stairs and use them. Fix handrails that are broken or loose. Make sure that handrails are as long as the stairways.  Check any carpeting to make sure that it is firmly attached to the stairs. Fix any carpet that is loose or worn.  Avoid having throw rugs at the top or bottom of the stairs. If you do have throw rugs, attach them to the floor with carpet tape.  Make sure that you have a light switch at the top of the stairs and the bottom of the stairs. If you do not have them, ask someone to add them for you. What else can I do to help prevent falls?  Wear shoes that:  Do not have high heels.  Have rubber bottoms.  Are comfortable and fit you well.  Are closed at the toe. Do not wear sandals.  If  you use a stepladder:  Make sure that it is fully opened. Do not climb a closed stepladder.  Make sure that both sides of the stepladder are locked into place.  Ask someone to hold it for you, if possible.  Clearly mark and make sure that you can see:  Any grab bars or handrails.  First and last steps.  Where the edge of each step is.  Use tools that help you move around (mobility aids) if they are needed. These include:  Canes.  Walkers.  Scooters.  Crutches.  Turn on the lights when you go into a dark area. Replace any light bulbs as soon as they burn out.  Set up your furniture so you have a clear path. Avoid moving your furniture around.  If any of your floors are uneven, fix them.  If there are any pets around you, be aware of where they are.  Review your medicines with your doctor. Some medicines can make you feel dizzy. This can increase your chance of falling. Ask your doctor what other things that you can do to help prevent falls. This information is not intended to replace advice given to you by your health care provider. Make sure you discuss any questions you have with your health care provider. Document Released: 02/21/2009 Document Revised: 10/03/2015 Document Reviewed: 06/01/2014 Elsevier Interactive Patient Education  2017 [employer].

## 2020-01-26 ENCOUNTER — Ambulatory Visit (INDEPENDENT_AMBULATORY_CARE_PROVIDER_SITE_OTHER): Payer: Medicare Other | Admitting: Family Medicine

## 2020-01-26 ENCOUNTER — Other Ambulatory Visit: Payer: Self-pay

## 2020-01-26 ENCOUNTER — Encounter: Payer: Self-pay | Admitting: Family Medicine

## 2020-01-26 VITALS — BP 150/80 | HR 79 | Temp 97.5°F | Ht 62.25 in | Wt 159.6 lb

## 2020-01-26 DIAGNOSIS — R413 Other amnesia: Secondary | ICD-10-CM

## 2020-01-26 DIAGNOSIS — Z23 Encounter for immunization: Secondary | ICD-10-CM

## 2020-01-26 DIAGNOSIS — Z0000 Encounter for general adult medical examination without abnormal findings: Secondary | ICD-10-CM

## 2020-01-26 DIAGNOSIS — I10 Essential (primary) hypertension: Secondary | ICD-10-CM | POA: Diagnosis not present

## 2020-01-26 MED ORDER — AMLODIPINE BESYLATE 5 MG PO TABS: 5.0000 mg | ORAL_TABLET | Freq: Every day | ORAL | 3 refills | Status: DC

## 2020-01-26 MED ORDER — DONEPEZIL HCL 10 MG PO TABS: 10.0000 mg | ORAL_TABLET | Freq: Every day | ORAL | 3 refills | Status: DC

## 2020-01-26 NOTE — Patient Instructions (Signed)
Don't change your meds but recheck your BP at home or at church.  Update me if persistently above 140/90.  Flu shot today.  Thanks for getting that done.   Talk to your son about driving.  Update me as needed.   Plan on recheck in about 6 months, sooner if needed.   Please get a covid vaccine when possible.   Take care.  Glad to see you.

## 2020-01-26 NOTE — Progress Notes (Signed)
This visit occurred during the SARS-CoV-2 public health emergency.  Safety protocols were in place, including screening questions prior to the visit, additional usage of staff PPE, and extensive cleaning of exam room while observing appropriate contact time as indicated for disinfecting solutions.  BP elevation noted.  She admitted the drive into clinic was stressful.  No CP.  Not SOB.  No BLE edema.  She is still working out 5 days a week at [employer].    Memory d/w pt.  Still on donepezil.  Compliant.  Her memory had likely been slowly getting worse.  Still living at home. Still mostly independent but her son is next door and family is helping with yardwork.  She isn't cooking as much.  She is eating out some.  She is taking carnation instant breakfast to supplement her meals.  She declined options like Meals on Wheels.  Discussed at office visit.  We talked about driving, usually only to the gym/locally- she avoid high traffic areas.  No MVA or violations.  She had recent eye exam in [place].      Fall cautions d/w pt.  She had tripped on her steps.  No weakness or balance troubles o/w.  R knee was sore after the fact but she has continued working out, she gets on a treadmill mult times per week.    Recheck memory today.  Normal orientation x3.  3/3 attention.  Can do math.  Can read a watch.  3/3 recall.  WORLD--->"DLOW" (4/5 correct).  covid vaccine encouraged.  D/w pt. she initially said she did not want to get it.  She will not say why she does not want to get it.  I told her that I hope she does get it as she would be high risk if she does get Covid. Flu shot today.   Mammogram and dexa declined.    Meds, vitals, and allergies reviewed.   ROS: Per HPI unless specifically indicated in ROS section   GEN: nad, alert and oriented HEENT: ncat NECK: supple w/o LA CV: rrr. PULM: ctab, no inc wob ABD: soft, +bs EXT: no edema SKIN: no acute rash

## 2020-01-28 DIAGNOSIS — I10 Essential (primary) hypertension: Secondary | ICD-10-CM | POA: Insufficient documentation

## 2020-01-28 HISTORY — DX: Essential (primary) hypertension: I10

## 2020-01-28 NOTE — Assessment & Plan Note (Signed)
covid vaccine encouraged.  D/w pt. she initially said she did not want to get it.  She will not say why she does not want to get it.  I told her that I hope she does get it as she would be high risk if she does get Covid. Flu shot today.   Mammogram and dexa declined.

## 2020-01-28 NOTE — Assessment & Plan Note (Addendum)
She missed 1 letter on spelling world backward.  She has normal orientation otherwise.  Routine cautions given to patient.  She is curtailed driving in busy areas where she is unfamiliar.  She is still mostly independent at home except for yard work.  She will continue her current medications for now.  I did ask her to talk with her son about what options would be in the future if she stopped driving entirely.  I would prefer her to have this conversation with her son preemptively.  At least 30 minutes were devoted to patient care in this encounter (this can potentially include time spent reviewing the patient's file/history, interviewing and examining the patient, counseling/reviewing plan with patient, ordering referrals, ordering tests, reviewing relevant laboratory or x-ray data, and documenting the encounter).

## 2020-01-28 NOTE — Assessment & Plan Note (Signed)
Continue amlodipine.  Recheck blood pressure was improved.  I do not want to induce hypotension.  Labs discussed with patient.

## 2020-07-25 ENCOUNTER — Ambulatory Visit: Payer: Medicare Other | Admitting: Family Medicine

## 2020-07-25 ENCOUNTER — Other Ambulatory Visit: Payer: Self-pay

## 2020-07-25 ENCOUNTER — Encounter: Payer: Self-pay | Admitting: Family Medicine

## 2020-07-25 VITALS — BP 124/82 | HR 78 | Temp 97.6°F | Ht 62.25 in | Wt 157.0 lb

## 2020-07-25 DIAGNOSIS — R413 Other amnesia: Secondary | ICD-10-CM | POA: Diagnosis not present

## 2020-07-25 DIAGNOSIS — Z2821 Immunization not carried out because of patient refusal: Secondary | ICD-10-CM

## 2020-07-25 NOTE — Progress Notes (Signed)
This visit occurred during the SARS-CoV-2 public health emergency.  Safety protocols were in place, including screening questions prior to the visit, additional usage of staff PPE, and extensive cleaning of exam room while observing appropriate contact time as indicated for disinfecting solutions.  She is still going to the gym early AM on weekdays, 5 days per week.   No CP, SOB, BLE edema.  Still on amlodipine 5mg  a day.  Not lightheaded with standing or working out.    Memory d/w pt.  Still on donepezil.  Compliant.  Her memory had likely been very slowly getting worse over the years but she didn't see much recent change.  Still living at home. Still mostly independent but her son is next door and family is helping with yardwork.  She isn't cooking as much.  She is eating out some.  She is still taking carnation instant breakfast to supplement her meals.  She declined options like Meals on Wheels.  Discussed again at office visit.  We talked about driving, usually only to the gym/locally- she avoid high traffic areas.  No MVA or violations.    Recheck memory today.  Normal orientation x3.  3/3 attention.  Can do math.  Can read a watch.  3/3 recall.  WORLD--->"DLROW" (5/5 correct).  She declined covid vaccine.  Her son had covid.  He recovered.  Discussed.  She is absolute in her refusal to get vaccinated.  She has no medical reason not to get vaccinated and it would be appropriate to get vaccinated..  Meds, vitals, and allergies reviewed.   ROS: Per HPI unless specifically indicated in ROS section   GEN: nad, alert and oriented HEENT: ncat NECK: supple w/o LA CV: rrr.  PULM: ctab, no inc wob ABD: soft, +bs EXT: no edema SKIN: no acute rash

## 2020-07-25 NOTE — Patient Instructions (Addendum)
Don't change your meds for now. Update me as needed.  Take care.  Glad to see you. Plan on recheck in about 6 months at a yearly visit.  We can do labs at the visit.

## 2020-07-28 DIAGNOSIS — Z2821 Immunization not carried out because of patient refusal: Secondary | ICD-10-CM

## 2020-07-28 HISTORY — DX: Immunization not carried out because of patient refusal: Z28.21

## 2020-07-28 NOTE — Assessment & Plan Note (Signed)
She has had very gradual changes in the meantime.  She still going to the gym 5 days a week.  She is still social.  She is compliant with her medications.  There are no red flag events in terms of her memory.  I think it makes sense to continue donepezil as is for now and recheck periodically.  If she notices worsening in her memory in the meantime or she has other concerns that she will update me. 32 minutes were devoted to patient care in this encounter (this includes time spent reviewing the patient's file/history, interviewing and examining the patient, counseling/reviewing plan with patient).

## 2020-07-28 NOTE — Assessment & Plan Note (Signed)
She declined covid vaccine.  Her son had covid.  He recovered.  Discussed.  She is absolute in her refusal to get vaccinated.  She has no medical reason not to get vaccinated and it would be appropriate to get vaccinated.Marland Kitchen

## 2021-01-20 ENCOUNTER — Other Ambulatory Visit: Payer: Medicare Other

## 2021-01-28 ENCOUNTER — Other Ambulatory Visit: Payer: Self-pay | Admitting: Family Medicine

## 2021-01-28 ENCOUNTER — Encounter: Payer: Medicare Other | Admitting: Family Medicine

## 2021-01-31 ENCOUNTER — Encounter: Payer: Medicare Other | Admitting: Family Medicine

## 2021-02-27 ENCOUNTER — Other Ambulatory Visit: Payer: Self-pay | Admitting: Family Medicine

## 2021-05-16 ENCOUNTER — Encounter: Payer: Medicare Other | Admitting: Family Medicine

## 2021-06-10 ENCOUNTER — Encounter: Payer: Self-pay | Admitting: Family Medicine

## 2021-06-10 ENCOUNTER — Ambulatory Visit: Payer: Medicare Other | Admitting: Family Medicine

## 2021-06-10 ENCOUNTER — Other Ambulatory Visit: Payer: Self-pay

## 2021-06-10 VITALS — BP 126/80 | HR 93 | Temp 98.3°F | Ht 62.25 in | Wt 151.0 lb

## 2021-06-10 DIAGNOSIS — R739 Hyperglycemia, unspecified: Secondary | ICD-10-CM | POA: Diagnosis not present

## 2021-06-10 DIAGNOSIS — M810 Age-related osteoporosis without current pathological fracture: Secondary | ICD-10-CM | POA: Diagnosis not present

## 2021-06-10 DIAGNOSIS — I10 Essential (primary) hypertension: Secondary | ICD-10-CM | POA: Diagnosis not present

## 2021-06-10 DIAGNOSIS — R300 Dysuria: Secondary | ICD-10-CM

## 2021-06-10 DIAGNOSIS — R413 Other amnesia: Secondary | ICD-10-CM

## 2021-06-10 MED ORDER — SULFAMETHOXAZOLE-TRIMETHOPRIM 800-160 MG PO TABS: 1.0000 | ORAL_TABLET | Freq: Two times a day (BID) | ORAL | 0 refills | Status: DC

## 2021-06-10 NOTE — Progress Notes (Signed)
This visit occurred during the SARS-CoV-2 public health emergency.  Safety protocols were in place, including screening questions prior to the visit, additional usage of staff PPE, and extensive cleaning of exam room while observing appropriate contact time as indicated for disinfecting solutions.  dysuria: yes duration of symptoms: going on for 2-3 days.  abdominal pain:no fevers: no back pain: no vomiting: no No rash.  No discharge.   She couldn't give u/a sample at OV, d/w pt.   We talked about getting her labs done for her upcoming physical.  See notes on labs.  Meds, vitals, and allergies reviewed.  Per HPI unless specifically indicated in ROS section   GEN: nad, alert and pleasant in conversation. HEENT: ncat NECK: supple CV: rrr.  PULM: ctab, no inc wob ABD: soft, +bs, suprapubic area not tender EXT: no edema SKIN: well perfused.   BACK: no CVA pain

## 2021-06-10 NOTE — Patient Instructions (Addendum)
Go to the lab on the way out.   If you have mychart we'll likely use that to update you.    Take care.  Glad to see you. Start septra today and update me if you keep having pain with urination.  Take 1 pill twice a day for 3 days.

## 2021-06-11 DIAGNOSIS — R300 Dysuria: Secondary | ICD-10-CM | POA: Insufficient documentation

## 2021-06-11 HISTORY — DX: Dysuria: R30.0

## 2021-06-11 LAB — CBC WITH DIFFERENTIAL/PLATELET
Basophils Absolute: 0.1 10*3/uL (ref 0.0–0.1)
Basophils Relative: 0.6 % (ref 0.0–3.0)
Eosinophils Absolute: 0.3 10*3/uL (ref 0.0–0.7)
Eosinophils Relative: 2.6 % (ref 0.0–5.0)
HCT: 39.2 % (ref 36.0–46.0)
Hemoglobin: 12.5 g/dL (ref 12.0–15.0)
Lymphocytes Relative: 26.5 % (ref 12.0–46.0)
Lymphs Abs: 3.3 10*3/uL (ref 0.7–4.0)
MCHC: 32 g/dL (ref 30.0–36.0)
MCV: 88.5 fl (ref 78.0–100.0)
Monocytes Absolute: 0.8 10*3/uL (ref 0.1–1.0)
Monocytes Relative: 6.1 % (ref 3.0–12.0)
Neutro Abs: 7.9 10*3/uL — ABNORMAL HIGH (ref 1.4–7.7)
Neutrophils Relative %: 64.2 % (ref 43.0–77.0)
Platelets: 269 10*3/uL (ref 150.0–400.0)
RBC: 4.43 Mil/uL (ref 3.87–5.11)
RDW: 15 % (ref 11.5–15.5)
WBC: 12.4 10*3/uL — ABNORMAL HIGH (ref 4.0–10.5)

## 2021-06-11 LAB — LIPID PANEL
Cholesterol: 184 mg/dL (ref 0–200)
HDL: 48.9 mg/dL (ref >39.00)
LDL Cholesterol: 119 mg/dL — ABNORMAL HIGH (ref 0–99)
NonHDL: 135.43
Total CHOL/HDL Ratio: 4
Triglycerides: 84 mg/dL (ref 0.0–149.0)
VLDL: 16.8 mg/dL (ref 0.0–40.0)

## 2021-06-11 LAB — COMPREHENSIVE METABOLIC PANEL
ALT: 12 U/L (ref 0–35)
AST: 19 U/L (ref 0–37)
Albumin: 4 g/dL (ref 3.5–5.2)
Alkaline Phosphatase: 60 U/L (ref 39–117)
BUN: 20 mg/dL (ref 6–23)
CO2: 32 mEq/L (ref 19–32)
Calcium: 9.7 mg/dL (ref 8.4–10.5)
Chloride: 100 mEq/L (ref 96–112)
Creatinine, Ser: 0.98 mg/dL (ref 0.40–1.20)
GFR: 50.81 mL/min — ABNORMAL LOW (ref >60.00)
Glucose, Bld: 95 mg/dL (ref 70–99)
Potassium: 4 mEq/L (ref 3.5–5.1)
Sodium: 138 mEq/L (ref 135–145)
Total Bilirubin: 0.4 mg/dL (ref 0.2–1.2)
Total Protein: 6.9 g/dL (ref 6.0–8.3)

## 2021-06-11 LAB — HEMOGLOBIN A1C: Hgb A1c MFr Bld: 5.9 % (ref 4.6–6.5)

## 2021-06-11 LAB — VITAMIN D 25 HYDROXY (VIT D DEFICIENCY, FRACTURES): VITD: 74.65 ng/mL (ref 30.00–100.00)

## 2021-06-11 LAB — TSH: TSH: 4.66 u[IU]/mL (ref 0.35–5.50)

## 2021-06-11 LAB — VITAMIN B12: Vitamin B-12: 1334 pg/mL — ABNORMAL HIGH (ref 211–911)

## 2021-06-11 NOTE — Assessment & Plan Note (Addendum)
Unable to give urine sample.  Will treat preemptively.  Presumed UTI. Start septra today and update me if still having pain with urination.  She agrees with plan. Okay for outpatient follow-up.

## 2021-06-24 ENCOUNTER — Other Ambulatory Visit: Payer: Self-pay

## 2021-06-24 ENCOUNTER — Encounter: Payer: Self-pay | Admitting: Family Medicine

## 2021-06-24 ENCOUNTER — Ambulatory Visit (INDEPENDENT_AMBULATORY_CARE_PROVIDER_SITE_OTHER): Payer: Medicare Other | Admitting: Family Medicine

## 2021-06-24 VITALS — BP 134/82 | HR 83 | Temp 97.6°F | Ht 62.25 in | Wt 151.0 lb

## 2021-06-24 DIAGNOSIS — R413 Other amnesia: Secondary | ICD-10-CM

## 2021-06-24 DIAGNOSIS — Z0000 Encounter for general adult medical examination without abnormal findings: Secondary | ICD-10-CM | POA: Diagnosis not present

## 2021-06-24 DIAGNOSIS — Z7189 Other specified counseling: Secondary | ICD-10-CM

## 2021-06-24 DIAGNOSIS — I10 Essential (primary) hypertension: Secondary | ICD-10-CM | POA: Diagnosis not present

## 2021-06-24 MED ORDER — DONEPEZIL HCL 10 MG PO TABS: 10.0000 mg | ORAL_TABLET | Freq: Every day | ORAL | 3 refills | Status: DC

## 2021-06-24 MED ORDER — AMLODIPINE BESYLATE 5 MG PO TABS: 5.0000 mg | ORAL_TABLET | Freq: Every day | ORAL | 3 refills | Status: DC

## 2021-06-24 NOTE — Progress Notes (Signed)
This visit occurred during the SARS-CoV-2 public health emergency.  Safety protocols were in place, including screening questions prior to the visit, additional usage of staff PPE, and extensive cleaning of exam room while observing appropriate contact time as indicated for disinfecting solutions.  I have personally reviewed the Medicare Annual Wellness questionnaire and have noted 1. The patient's medical and social history 2. Their use of alcohol, tobacco or illicit drugs 3. Their current medications and supplements 4. The patient's functional ability including ADL's, fall risks, home safety risks and hearing or visual             impairment. 5. Diet and physical activities 6. Evidence for depression or mood disorders  The patients weight, height, BMI have been recorded in the chart and visual acuity is per eye clinic.  I have made referrals, counseling and provided education to the patient based review of the above and I have provided the pt with a written personalized care plan for preventive services.  Provider list updated- see scanned forms.  Routine anticipatory guidance given to patient.  See health maintenance. The possibility exists that previously documented standard health maintenance information may have been brought forward from a previous encounter into this note.  If needed, that same information has been updated to reflect the current situation based on today's encounter.    Flu 2022 Shingles 2020 PNA 2022 Tetanus 2020 COVID-vaccine declined.  Discussed with patient Colon cancer screening not due given her age Breast cancer screening declined.  She would not want treatment or intervention. Bone density test deferred by patient 2023 Advance directive-son John designated if patient were incapacitated. Cognitive function addressed- see scanned forms- and if abnormal then additional documentation follows.   In addition to [facility] Wellness, follow up visit for the below  conditions:  Hypertension:    Using medication without problems or lightheadedness: yes Chest pain with exertion:no Edema:no Short of breath: no Labs d/w pt.    Memory d/w pt.  Still on aricept at baseline.  She has noted gradual changes over the years.  Her memory is worse under higher stress events.  She was worried about coming to the OV today but still came to the clinic on time with the correct route.  No red flag events.  D/w pt about continuing aricept as is.  Normal brief memory testing today.  Prev dysuria resolved with abx course.  No sx now.  Would defer u/a and ucx at this point, d/w pt.    PMH and SH reviewed  Meds, vitals, and allergies reviewed.   ROS: Per HPI.  Unless specifically indicated otherwise in HPI, the patient denies:  General: fever. Eyes: acute vision changes ENT: sore throat Cardiovascular: chest pain Respiratory: SOB GI: vomiting GU: dysuria Musculoskeletal: acute back pain Derm: acute rash Neuro: acute motor dysfunction Psych: worsening mood Endocrine: polydipsia Heme: bleeding Allergy: hayfever  GEN: nad, alert and oriented HEENT: Normocephalic/atraumatic NECK: supple w/o LA CV: rrr. PULM: ctab, no inc wob ABD: soft, +bs EXT: no edema SKIN: Well-perfused.

## 2021-06-24 NOTE — Patient Instructions (Signed)
Update me as needed. If you notice more memory changes the please call the clinic.  Take care.  Glad to see you.

## 2021-06-29 NOTE — Assessment & Plan Note (Signed)
Continue work on diet and exercise.  Continue amlodipine.  She agrees to plan.

## 2021-06-29 NOTE — Assessment & Plan Note (Signed)
Advance directive-son John designated if patient were incapacitated.

## 2021-06-29 NOTE — Assessment & Plan Note (Signed)
Flu 2022 Shingles 2020 PNA 2022 Tetanus 2020 COVID-vaccine declined.  Discussed with patient Colon cancer screening not due given her age Breast cancer screening declined.  She would not want treatment or intervention. Bone density test deferred by patient 2023 Advance directive-son John designated if patient were incapacitated. Cognitive function addressed- see scanned forms- and if abnormal then additional documentation follows.

## 2021-06-29 NOTE — Assessment & Plan Note (Signed)
Normal brief testing today.  Alert and oriented.  Would continue Aricept.  Routine cautions given to patient.  Reasonable to still observe for now.  She can update me if she notices further changes.

## 2021-08-04 ENCOUNTER — Ambulatory Visit: Payer: Medicare Other | Admitting: Family Medicine

## 2021-08-04 ENCOUNTER — Other Ambulatory Visit: Payer: Self-pay

## 2021-08-04 ENCOUNTER — Encounter: Payer: Self-pay | Admitting: Family Medicine

## 2021-08-04 VITALS — BP 142/80 | HR 76 | Temp 97.7°F | Ht 62.25 in | Wt 147.5 lb

## 2021-08-04 DIAGNOSIS — J309 Allergic rhinitis, unspecified: Secondary | ICD-10-CM

## 2021-08-04 DIAGNOSIS — J301 Allergic rhinitis due to pollen: Secondary | ICD-10-CM

## 2021-08-04 DIAGNOSIS — J209 Acute bronchitis, unspecified: Secondary | ICD-10-CM | POA: Insufficient documentation

## 2021-08-04 HISTORY — DX: Acute bronchitis, unspecified: J20.9

## 2021-08-04 HISTORY — DX: Allergic rhinitis, unspecified: J30.9

## 2021-08-04 MED ORDER — PREDNISONE 10 MG PO TABS: ORAL_TABLET | ORAL | 0 refills | Status: DC

## 2021-08-04 MED ORDER — FLUTICASONE PROPIONATE 50 MCG/ACT NA SUSP: 2.0000 | Freq: Every day | NASAL | 1 refills | Status: DC

## 2021-08-04 NOTE — Assessment & Plan Note (Signed)
Seasonal/ in setting of tree pollen  ? ?Refilled flonase to use once daily  ?Suggested antihistamine otc like claritin or allegra or zyrtec  ?Avoid allergens if possible ?Of note prednisone was px today also for bronchitis -no doubt will help nasal symptoms short term ?Handout given  ?

## 2021-08-04 NOTE — Assessment & Plan Note (Signed)
Suspect viral (plus/minus allergic), with some rhonchi and wheeze on exam  ?No resp distress ?Prednisone px 40 mg taper/discussed expected side effects  ?Fluids/rest  ?mucinex if needed  ?Update if not starting to improve in a week or if worsening  ?ER precautions discussed  ? ? ?

## 2021-08-04 NOTE — Progress Notes (Signed)
? ?Subjective:  ? ? Patient ID: Katrina Armstrong, female    DOB: [birth year], 88 y.o.   MRN: [id number] ? ?This visit occurred during the SARS-CoV-2 public health emergency.  Safety protocols were in place, including screening questions prior to the visit, additional usage of staff PPE, and extensive cleaning of exam room while observing appropriate contact time as indicated for disinfecting solutions.  ? ?HPI ?88 yo pt of Dr Damita Dunnings presents for nasal symptoms for 2 weeks ? ?Wt Readings from Last 3 Encounters:  ?08/04/21 147 lb 8 oz (66.9 kg)  ?06/24/21 151 lb (68.5 kg)  ?06/10/21 151 lb (68.5 kg)  ? ?26.76 kg/m? ? ?Thinks this is allergies to pollen  ?Cough - little tiny bit of phlegm/ scant color  ?Sneezing - a lot  ?Nasal congestion - sometimes light yellow  ?No pain in face or pressure  ? ?No ST ?Ears are ok  ? ?No wheezing  ?No asthma or copd  ? ?Otc ?Mucinex (unsure what kind)  ?Not using flonase -does not have  ?No antihistamines  ? ?No cough medicines  ? ?No saline currently  ? ?Patient Active Problem List  ? Diagnosis Date Noted  ? Acute bronchitis 08/04/2021  ? Allergic rhinitis 08/04/2021  ? Dysuria 06/11/2021  ? COVID-19 vaccination declined 07/28/2020  ? HTN (hypertension) 01/28/2020  ? Constipation 07/06/2018  ? Varicose vein of leg 01/23/2018  ? Plantar fasciitis of right foot 03/09/2017  ? Health care maintenance 01/13/2017  ? Trochanteric bursitis of right hip 01/01/2015  ? Medicare annual wellness visit, subsequent 12/20/2013  ? Advance care planning 12/20/2013  ? Benign paroxysmal positional vertigo 05/05/2013  ? Dysfunction of eustachian tube 05/05/2013  ? Fatigue 08/19/2012  ? GERD (gastroesophageal reflux disease) 08/19/2012  ? Hyperglycemia 03/28/2012  ? Vulvar lesion (Seborrheic keratosis) 07/01/2011  ? Shoulder pain 06/05/2011  ? Pulmonary embolism ([place]) 12/21/2010  ? NSTEMI (non-ST elevated myocardial infarction) ([place]) 12/08/2010  ? HLD (hyperlipidemia) 05/19/2010  ? Osteoporosis 05/19/2010  ?  Memory loss 05/19/2010  ? DIVERTICULITIS, HX OF 05/19/2010  ? ?Past Medical History:  ?Diagnosis Date  ? Alzheimer's dementia ([place])   ? GERD (gastroesophageal reflux disease)   ? History of cardiovascular stress test 06/05/09  ? [facility]- [facility]  Echo- mild AS/mod LVH. Myocardial perfusion imaging is normal. Overall left ventricular function is normal without regional wall motion abnormalities.  ? Hyperlipidemia   ? Memory loss   ? Normal echocardiogram   ? 12/2010, EF 55-60%  ? Osteoporosis, unspecified   ? Personal history of colonic polyps   ? Personal history of other diseases of digestive system   ? Pulmonary embolism ([place])   ? 12/2010  ? Wears dentures   ? full upper and lower  ? ?Past Surgical History:  ?Procedure Laterality Date  ? APPENDECTOMY    ? HEMORROIDECTOMY    ? MASS EXCISION Right 01/31/2016  ? Procedure: EXCISION MASS RIGHT LOWER LIP;  Surgeon: Beverly Gust, MD;  Location: [facility];  Service: ENT;  Laterality: Right;  ? ?Social History  ? ?Tobacco Use  ? Smoking status: Former  ?  Types: Cigarettes  ?  Quit date: 05/11/1978  ?  Years since quitting: 43.2  ? Smokeless tobacco: Never  ?Vaping Use  ? Vaping Use: Never used  ?Substance Use Topics  ? Alcohol use: No  ? Drug use: No  ? ?Family History  ?Problem Relation Age of Onset  ? Hypertension Mother   ? Dementia Mother   ? Melanoma  Father   ? Hypertension Other   ?     fam hx  ? Stroke Other   ?     sibling  ? Other Other   ?     PNA- sibling/brain tumor- sibling  ? Cancer Sister   ?     brain tumor  ? Breast cancer Neg Hx   ? Colon cancer Neg Hx   ? ?Allergies  ?Allergen Reactions  ? Boniva [Ibandronate Sodium]   ?  Lack of effect  ? Raloxifene   ?  Stopped due to DVT/PE 12/2010  ? ?Current Outpatient Medications on File Prior to Visit  ?Medication Sig Dispense Refill  ? amLODipine (NORVASC) 5 MG tablet Take 1 tablet (5 mg total) by mouth daily. 90 tablet 3  ? Calcium Carb-Cholecalciferol 600-400 MG-UNIT TABS Take 1 tablet by mouth daily.    ?  Cholecalciferol (VITAMIN D) 2000 UNITS CAPS Take 1 capsule by mouth daily.    ? cyanocobalamin 1000 MCG tablet Take 1,000 mcg by mouth daily.    ? docusate sodium (COLACE) 100 MG capsule Take 100 mg by mouth as needed.    ? donepezil (ARICEPT) 10 MG tablet Take 1 tablet (10 mg total) by mouth daily. 90 tablet 3  ? fluticasone (FLONASE) 50 MCG/ACT nasal spray Place 2 sprays into both nostrils daily.    ? ibuprofen (ADVIL) 200 MG tablet Take 200 mg by mouth every 6 (six) hours as needed.    ? Nutritional Supplements (CARNATION INSTANT BREAKFAST) LIQD Take by mouth 3 (three) times daily.    ? polyethylene glycol powder (GLYCOLAX/MIRALAX) powder Take 17 g by mouth daily as needed for mild constipation.    ? ?No current facility-administered medications on file prior to visit.  ?  ?Review of Systems  ?Constitutional:  Negative for activity change, appetite change, fatigue, fever and unexpected weight change.  ?HENT:  Positive for congestion and rhinorrhea. Negative for ear pain, sinus pressure, sinus pain, sore throat and voice change.   ?Eyes:  Negative for pain, redness and visual disturbance.  ?Respiratory:  Positive for cough. Negative for shortness of breath, wheezing and stridor.   ?Cardiovascular:  Negative for chest pain and palpitations.  ?Gastrointestinal:  Negative for abdominal pain, blood in stool, constipation and diarrhea.  ?Endocrine: Negative for polydipsia and polyuria.  ?Genitourinary:  Negative for dysuria, frequency and urgency.  ?Musculoskeletal:  Negative for arthralgias, back pain and myalgias.  ?Skin:  Negative for pallor and rash.  ?Allergic/Immunologic: Negative for environmental allergies.  ?Neurological:  Negative for dizziness, syncope and headaches.  ?Hematological:  Negative for adenopathy. Does not bruise/bleed easily.  ?Psychiatric/Behavioral:  Negative for decreased concentration and dysphoric mood. The patient is not nervous/anxious.   ? ?   ?Objective:  ? Physical  Exam ?Constitutional:   ?   General: She is not in acute distress. ?   Appearance: Normal appearance. She is well-developed and normal weight. She is not ill-appearing or diaphoretic.  ?HENT:  ?   Head: Normocephalic and atraumatic.  ?   Comments: No sinus tenderness ?   Right Ear: Tympanic membrane, ear canal and external ear normal.  ?   Left Ear: Tympanic membrane, ear canal and external ear normal.  ?   Nose: Congestion and rhinorrhea present.  ?   Mouth/Throat:  ?   Mouth: Mucous membranes are moist.  ?   Pharynx: Oropharynx is clear. No posterior oropharyngeal erythema.  ?   Comments: Clear pnd  ?Eyes:  ?  General: No scleral icterus. ?   Conjunctiva/sclera: Conjunctivae normal.  ?   Pupils: Pupils are equal, round, and reactive to light.  ?Neck:  ?   Thyroid: No thyromegaly.  ?   Vascular: No carotid bruit or JVD.  ?Cardiovascular:  ?   Rate and Rhythm: Normal rate and regular rhythm.  ?   Heart sounds: Normal heart sounds.  ?  No gallop.  ?Pulmonary:  ?   Effort: Pulmonary effort is normal. No respiratory distress.  ?   Breath sounds: Wheezing and rhonchi present. No rales.  ?   Comments: Faint scattered wheezing and rhonchi  ? ?Expiratory phase is not prolonged  ? ?Cough is wet sounding  ?Chest:  ?   Chest wall: No tenderness.  ?Abdominal:  ?   General: There is no distension or abdominal bruit.  ?   Palpations: Abdomen is soft.  ?Musculoskeletal:  ?   Cervical back: Normal range of motion and neck supple.  ?   Right lower leg: No edema.  ?   Left lower leg: No edema.  ?Lymphadenopathy:  ?   Cervical: No cervical adenopathy.  ?Skin: ?   General: Skin is warm and dry.  ?   Coloration: Skin is not pale.  ?   Findings: No rash.  ?Neurological:  ?   Mental Status: She is alert.  ?   Coordination: Coordination normal.  ?   Deep Tendon Reflexes: Reflexes are normal and symmetric. Reflexes normal.  ?Psychiatric:     ?   Mood and Affect: Mood normal.  ? ? ? ? ? ?   ?Assessment & Plan:  ? ?Problem List Items  Addressed This Visit   ? ?  ? Respiratory  ? Acute bronchitis - Primary  ?  Suspect viral (plus/minus allergic), with some rhonchi and wheeze on exam  ?No resp distress ?Prednisone px 40 mg taper/discussed expected side effect

## 2021-08-04 NOTE — Patient Instructions (Signed)
I think you have some bronchitis (viral) along with allergies  ? ?Take the prednisone taper as directed  ?It may make you feel hyper and hungry  ?It should help cough and chest congestion  ? ?Use the flonase nasal spray for allergies ? ?Over the counter - claritin or allegra or zyrtec will help more with allergies (sneeze and runny nose)  ? ?Update if not starting to improve in a week or if worsening   ?

## 2021-08-26 ENCOUNTER — Other Ambulatory Visit: Payer: Self-pay | Admitting: Family Medicine

## 2021-09-08 ENCOUNTER — Other Ambulatory Visit: Payer: Self-pay | Admitting: Family Medicine

## 2021-09-22 DIAGNOSIS — S329XXA Fracture of unspecified parts of lumbosacral spine and pelvis, initial encounter for closed fracture: Secondary | ICD-10-CM

## 2021-09-22 HISTORY — DX: Fracture of unspecified parts of lumbosacral spine and pelvis, initial encounter for closed fracture: S32.9XXA

## 2021-09-24 ENCOUNTER — Telehealth: Payer: Self-pay | Admitting: Family Medicine

## 2021-09-24 NOTE — Telephone Encounter (Signed)
I have attempted without success to contact this patient by phone to I left a message on answering machine to check the status of fx.  ?

## 2021-09-24 NOTE — Telephone Encounter (Signed)
Please get update on patient re: pelvic fracture, seen at outside facility.  ?

## 2021-09-29 DIAGNOSIS — I480 Paroxysmal atrial fibrillation: Secondary | ICD-10-CM

## 2021-09-29 HISTORY — DX: Paroxysmal atrial fibrillation: I48.0

## 2021-10-01 ENCOUNTER — Telehealth: Payer: Self-pay | Admitting: Family Medicine

## 2021-10-01 NOTE — Telephone Encounter (Signed)
LMTCB

## 2021-10-01 NOTE — Telephone Encounter (Signed)
Please check on patient.  I saw that she was admitted with pelvic fx and A fib.  Please see about her current placement (ie at SNF vs home) and ask about follow up here and with cardiology.  Thanks.

## 2021-10-03 NOTE — Telephone Encounter (Signed)
lmtcb

## 2021-10-08 NOTE — Telephone Encounter (Signed)
LMTCB

## 2021-10-08 NOTE — Telephone Encounter (Signed)
LMTCB; also duplicate phone note. See other note as I am closing this note.

## 2021-10-17 NOTE — Telephone Encounter (Signed)
lmtcb

## 2021-10-21 ENCOUNTER — Telehealth: Payer: Self-pay | Admitting: Family Medicine

## 2021-10-21 DIAGNOSIS — I4891 Unspecified atrial fibrillation: Secondary | ICD-10-CM

## 2021-10-21 NOTE — Telephone Encounter (Signed)
New patient for AFIB  Needs referral to:  Dr. Shirlee More in [place] [address] [place] [address]  [phone]  Fax: [phone]

## 2021-10-22 NOTE — Telephone Encounter (Signed)
Done. Thanks.

## 2021-11-24 ENCOUNTER — Other Ambulatory Visit: Payer: Self-pay

## 2021-11-24 DIAGNOSIS — F0280 Dementia in other diseases classified elsewhere without behavioral disturbance: Secondary | ICD-10-CM | POA: Insufficient documentation

## 2021-11-24 DIAGNOSIS — K644 Residual hemorrhoidal skin tags: Secondary | ICD-10-CM | POA: Insufficient documentation

## 2021-11-24 DIAGNOSIS — K641 Second degree hemorrhoids: Secondary | ICD-10-CM

## 2021-11-24 DIAGNOSIS — S32811A Multiple fractures of pelvis with unstable disruption of pelvic ring, initial encounter for closed fracture: Secondary | ICD-10-CM

## 2021-11-24 DIAGNOSIS — Z972 Presence of dental prosthetic device (complete) (partial): Secondary | ICD-10-CM | POA: Insufficient documentation

## 2021-11-24 DIAGNOSIS — K5730 Diverticulosis of large intestine without perforation or abscess without bleeding: Secondary | ICD-10-CM | POA: Insufficient documentation

## 2021-11-24 DIAGNOSIS — E785 Hyperlipidemia, unspecified: Secondary | ICD-10-CM | POA: Insufficient documentation

## 2021-11-24 DIAGNOSIS — S3210XA Unspecified fracture of sacrum, initial encounter for closed fracture: Secondary | ICD-10-CM

## 2021-11-24 HISTORY — DX: Diverticulosis of large intestine without perforation or abscess without bleeding: K57.30

## 2021-11-24 HISTORY — DX: Second degree hemorrhoids: K64.1

## 2021-11-24 HISTORY — DX: Unspecified fracture of sacrum, initial encounter for closed fracture: S32.10XA

## 2021-11-24 HISTORY — DX: Residual hemorrhoidal skin tags: K64.4

## 2021-11-24 HISTORY — DX: Multiple fractures of pelvis with unstable disruption of pelvic ring, initial encounter for closed fracture: S32.811A

## 2021-11-26 ENCOUNTER — Ambulatory Visit: Payer: Medicare Other | Admitting: Cardiology

## 2021-11-26 ENCOUNTER — Encounter: Payer: Self-pay | Admitting: Cardiology

## 2021-11-26 VITALS — BP 151/70 | HR 67 | Ht 64.6 in | Wt 143.2 lb

## 2021-11-26 DIAGNOSIS — I351 Nonrheumatic aortic (valve) insufficiency: Secondary | ICD-10-CM | POA: Diagnosis not present

## 2021-11-26 DIAGNOSIS — R600 Localized edema: Secondary | ICD-10-CM | POA: Diagnosis not present

## 2021-11-26 DIAGNOSIS — I340 Nonrheumatic mitral (valve) insufficiency: Secondary | ICD-10-CM | POA: Diagnosis not present

## 2021-11-26 DIAGNOSIS — I480 Paroxysmal atrial fibrillation: Secondary | ICD-10-CM

## 2021-11-26 NOTE — Progress Notes (Signed)
Cardiology Office Note:    Date:  11/26/2021   ID:  Katrina Armstrong, DOB [birth year], MRN [id number]  PCP:  Tonia Ghent, MD  Cardiologist:  Jenean Lindau, MD   Referring MD: Tonia Ghent, MD    ASSESSMENT:    1. Paroxysmal atrial fibrillation (HCC)   2. Mild aortic regurgitation   3. Moderate mitral regurgitation   4. Pedal edema    PLAN:    In order of problems listed above:  Primary prevention stressed with the patient.  Importance of compliance with diet medication stressed and she vocalized understanding. Paroxysmal A-fib: Currently she is in sinus rhythm and EKG reveals sinus rhythm and nonspecific ST-T changes.  She is high risk candidate for anticoagulation and opinion.  She is agreeable and she has unstable gait according to her son.  She has fallen at times.  In view of this I think her anticoagulation is high risk and the benefits are low in her case and my recommendation would be that she stop anticoagulation medications.  Benefits risks explained to the patient and son and they vocalized understanding.  A copy of this note will be provided to them for review by the primary care at the facility where she lives for final opinion. Essential hypertension and pedal edema: Her blood pressure is stable however some of her pedal edema may be from the amlodipine.  I told the patient that she could talk to her primary care about switching medication to a medication such as ARB and she is agreeable.  Again this will have to be done by primary care as the patient will have to be monitored closely for follow-up blood pressures, pedal edema and Chem-7.  She understands. I reviewed records from [facility].  Echocardiogram report was reviewed.  She has mild to moderate mitral regurgitation and mild aortic regurgitation and this is stable and medical management given her comorbidities.  She will be seen in follow-up appointment on as needed basis.  She and her son had multiple  questions which were answered to their satisfaction.   Medication Adjustments/Labs and Tests Ordered: Current medicines are reviewed at length with the patient today.  Concerns regarding medicines are outlined above.  No orders of the defined types were placed in this encounter.  No orders of the defined types were placed in this encounter.    History of Present Illness:    Katrina Armstrong is a 88 y.o. female who is being seen today for the evaluation of paroxysmal atrial fibrillation and anticoagulation at the request of Damita Dunnings, Elveria Rising, MD..  Patient has past medical history of paroxysmal atrial fibrillation per notes from [facility].  Son accompanies him for this visit.  He is concerned about anticoagulation.  He feels that she is not steady on her feet and is very concerned about it.  No chest pain orthopnea or PND.  Patient is comfortable and 88 year old lady she is frail and she is brought in in a wheelchair.  She has history of essential hypertension.  There is also documentation of pulmonary embolism in the past.  Past Medical History:  Diagnosis Date   Acute bronchitis 08/04/2021   Advance care planning 12/20/2013   Advance directive- son [name] designated if patient were incapacitated.    Allergic rhinitis 08/04/2021   Alzheimer's dementia ([place])    Benign paroxysmal positional vertigo 05/05/2013   Closed fracture of right pelvis ([place]) 09/22/2021   Closed Malgaigne fracture of pelvis ([place]) 11/24/2021  Constipation 07/06/2018   COVID-19 vaccination declined 07/28/2020   Diverticular disease of colon 11/24/2021   Dysfunction of eustachian tube 05/05/2013   Dysuria 06/11/2021   Fatigue 08/19/2012   GERD (gastroesophageal reflux disease)    Health care maintenance 01/13/2017   History of cardiovascular stress test 06/05/2009   [facility]- [facility]  Echo- mild AS/mod LVH. Myocardial perfusion imaging is normal. Overall left ventricular function is normal without regional wall motion  abnormalities.   HLD (hyperlipidemia) 05/19/2010   2013- Pt was concerned about myalgias with statin.  She declined further treatment with statin.  Continue diet and exercise.     HTN (hypertension) 01/28/2020   Hyperglycemia 03/28/2012   Hyperlipidemia    Medicare annual wellness visit, subsequent 12/20/2013   Memory loss    NSTEMI (non-ST elevated myocardial infarction) ([place]) 12/08/2010   Admission to [facility] 12/08/10 with positive troponin but no sig coronary obstruction on cath 12/09/10.  Occurred during setting of PE.    Osteoporosis 05/19/2010   lack of response to Boniva, started Evista 12/11, stopped 8/12 with PE Improved on DXA 01/2012   Paroxysmal atrial fibrillation ([place]) 09/29/2021   Personal history of other diseases of digestive system    Plantar fasciitis of right foot 03/09/2017   Pulmonary embolism ([place])    12/2010   Residual hemorrhoidal skin tags 11/24/2021   Sacral fracture, closed ([place]) 11/24/2021   Second degree hemorrhoids 11/24/2021   Shoulder pain 06/05/2011   Trochanteric bursitis of right hip 01/01/2015   Varicose vein of leg 01/23/2018   Vulvar lesion (Seborrheic keratosis) 07/01/2011   Treated with TCA in the office 06/16/11, 07/01/11 and 07/15/11   Wears dentures    full upper and lower    Past Surgical History:  Procedure Laterality Date   APPENDECTOMY     HEMORROIDECTOMY     MASS EXCISION Right 01/31/2016   Procedure: EXCISION MASS RIGHT LOWER LIP;  Surgeon: Beverly Gust, MD;  Location: [facility];  Service: ENT;  Laterality: Right;    Current Medications: Current Meds  Medication Sig   acetaminophen (TYLENOL) 500 MG tablet Take 1,000 mg by mouth 3 (three) times daily as needed for mild pain, fever or headache.   amLODipine (NORVASC) 5 MG tablet Take 1 tablet (5 mg total) by mouth daily.   Cholecalciferol (VITAMIN D) 2000 UNITS CAPS Take 1 capsule by mouth daily.   cyanocobalamin 1000 MCG tablet Take 1,000 mcg by mouth daily.   donepezil (ARICEPT) 10 MG  tablet Take 1 tablet (10 mg total) by mouth daily.   ELIQUIS 5 MG TABS tablet Take 5 mg by mouth 2 (two) times daily.   metoprolol succinate (TOPROL-XL) 50 MG 24 hr tablet Take 50 mg by mouth daily.   polyethylene glycol powder (GLYCOLAX/MIRALAX) powder Take 17 g by mouth daily as needed for mild constipation.   sennosides-docusate sodium (SENOKOT-S) 8.6-50 MG tablet Take 2 tablets by mouth at bedtime.     Allergies:   Boniva [ibandronate sodium] and Raloxifene   Social History   Socioeconomic History   Marital status: Widowed    Spouse name: Not on file   Number of children: 1   Years of education: 56   Highest education level: Not on file  Occupational History   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OTHER    Comment: [employer]  Tobacco Use   Smoking status: Former    Types: Cigarettes    Quit date: 05/11/1978    Years since quitting: 43.5  Smokeless tobacco: Never  Vaping Use   Vaping Use: Never used  Substance and Sexual Activity   Alcohol use: No   Drug use: No   Sexual activity: Not Currently  Other Topics Concern   Not on file  Social History Narrative   All siblings dead, all sisters   Is from [place], [place]   Widowed as of 2011, lives alone   1 son, [name]   Regular exercise: yes, goes to the gym mult days per week.    Enjoys crafts, Development worker, community activity   Social Determinants of Health   Financial Resource Strain: Low Risk  (01/23/2020)   Overall Financial Resource Strain (CARDIA)    Difficulty of Paying Living Expenses: Not hard at all  Food Insecurity: No Food Insecurity (01/23/2020)   Hunger Vital Sign    Worried About Running Out of Food in the Last Year: Never true    Ran Out of Food in the Last Year: Never true  Transportation Needs: No Transportation Needs (01/23/2020)   PRAPARE - Hydrologist (Medical): No    Lack of Transportation (Non-Medical): No  Physical Activity: Sufficiently Active (01/23/2020)   Exercise  Vital Sign    Days of Exercise per Week: 5 days    Minutes of Exercise per Session: 30 min  Stress: No Stress Concern Present (01/23/2020)   [place]    Feeling of Stress : Not at all  Social Connections: Not on file     Family History: The patient's family history includes Cancer in her sister; Dementia in her mother; Hypertension in her mother and another family member; Melanoma in her father; Other in an other family member; Stroke in an other family member. There is no history of Breast cancer or Colon cancer.  ROS:   Please see the history of present illness.    All other systems reviewed and are negative.  EKGs/Labs/Other Studies Reviewed:    The following studies were reviewed today: SUMMARY  The left ventricular cavity is small.  There is moderate concentric left ventricular hypertrophy.  Left ventricular systolic function is normal.  LV ejection fraction = 60-65%.  The right ventricle is normal in size and function.  The left atrium is moderately dilated.  There is aortic valve sclerosis.  There is moderate aortic regurgitation.  There is mild to moderate mitral regurgitation.  Estimated right ventricular systolic pressure is 35 mmHg.  Mild pulmonary hypertension.  IVC size was normal.  There is no pericardial effusion.  There is no comparison study available.     Recent Labs: 06/10/2021: ALT 12; BUN 20; Creatinine, Ser 0.98; Hemoglobin 12.5; Platelets 269.0; Potassium 4.0; Sodium 138; TSH 4.66  Recent Lipid Panel    Component Value Date/Time   CHOL 184 06/10/2021 1521   TRIG 84.0 06/10/2021 1521   HDL 48.90 06/10/2021 1521   CHOLHDL 4 06/10/2021 1521   VLDL 16.8 06/10/2021 1521   LDLCALC 119 (H) 06/10/2021 1521   LDLDIRECT 149.2 08/18/2012 0911    Physical Exam:    VS:  BP (!) 151/70   Pulse 67   Ht 5' 4.6" (1.641 m)   Wt 143 lb 3.2 oz (65 kg)   SpO2 94%   BMI 24.13 kg/m     Wt  Readings from Last 3 Encounters:  11/26/21 143 lb 3.2 oz (65 kg)  08/04/21 147 lb 8 oz (66.9 kg)  06/24/21 151 lb (68.5 kg)  GEN: Patient is in no acute distress HEENT: Normal NECK: No JVD; No carotid bruits LYMPHATICS: No lymphadenopathy CARDIAC: S1 S2 regular, 2/6 systolic murmur at the apex. RESPIRATORY:  Clear to auscultation without rales, wheezing or rhonchi  ABDOMEN: Soft, non-tender, non-distended MUSCULOSKELETAL: Bilateral pedal 2+ edema; No deformity  SKIN: Warm and dry NEUROLOGIC:  Alert and oriented x 3 PSYCHIATRIC:  Normal affect    Signed, Jenean Lindau, MD  11/26/2021 3:44 PM    [facility]

## 2021-11-26 NOTE — Patient Instructions (Signed)
Medication Instructions:  ?Your physician recommends that you continue on your current medications as directed. Please refer to the Current Medication list given to you today.  ?*If you need a refill on your cardiac medications before your next appointment, please call your pharmacy* ? ? ?Lab Work: ?None ordered ?If you have labs (blood work) drawn today and your tests are completely normal, you will receive your results only by: ?MyChart Message (if you have MyChart) OR ?A paper copy in the mail ?If you have any lab test that is abnormal or we need to change your treatment, we will call you to review the results. ? ? ?Testing/Procedures: ?None ordered ? ? ?Follow-Up: ?At [facility], you and your health needs are our priority.  As part of our continuing mission to provide you with exceptional heart care, we have created designated Provider Care Teams.  These Care Teams include your primary Cardiologist (physician) and Advanced Practice Providers (APPs -  Physician Assistants and Nurse Practitioners) who all work together to provide you with the care you need, when you need it. ? ?We recommend signing up for the patient portal called "MyChart".  Sign up information is provided on this After Visit Summary.  MyChart is used to connect with patients for Virtual Visits (Telemedicine).  Patients are able to view lab/test results, encounter notes, upcoming appointments, etc.  Non-urgent messages can be sent to your provider as well.   ?To learn more about what you can do with MyChart, go to [email or web address].   ? ?Your next appointment:   ?As needed ? ?The format for your next appointment:   ?In Person ? ?Provider:   ?Rajan Revankar, MD ? ? ?Other Instructions ?NA ? ?

## 2021-12-08 ENCOUNTER — Encounter: Payer: Self-pay | Admitting: Family Medicine

## 2021-12-08 ENCOUNTER — Telehealth: Payer: Self-pay | Admitting: Family Medicine

## 2021-12-08 ENCOUNTER — Ambulatory Visit: Payer: Medicare Other | Admitting: Family Medicine

## 2021-12-08 VITALS — BP 124/80 | HR 80 | Temp 97.6°F | Ht 64.6 in | Wt 145.0 lb

## 2021-12-08 DIAGNOSIS — I10 Essential (primary) hypertension: Secondary | ICD-10-CM

## 2021-12-08 DIAGNOSIS — M810 Age-related osteoporosis without current pathological fracture: Secondary | ICD-10-CM | POA: Diagnosis not present

## 2021-12-08 LAB — COMPREHENSIVE METABOLIC PANEL
ALT: 11 U/L (ref 0–35)
AST: 19 U/L (ref 0–37)
Albumin: 4.1 g/dL (ref 3.5–5.2)
Alkaline Phosphatase: 89 U/L (ref 39–117)
BUN: 13 mg/dL (ref 6–23)
CO2: 28 mEq/L (ref 19–32)
Calcium: 9.4 mg/dL (ref 8.4–10.5)
Chloride: 101 mEq/L (ref 96–112)
Creatinine, Ser: 0.93 mg/dL (ref 0.40–1.20)
GFR: 53.92 mL/min — ABNORMAL LOW (ref >60.00)
Glucose, Bld: 87 mg/dL (ref 70–99)
Potassium: 4.1 mEq/L (ref 3.5–5.1)
Sodium: 138 mEq/L (ref 135–145)
Total Bilirubin: 0.3 mg/dL (ref 0.2–1.2)
Total Protein: 6.8 g/dL (ref 6.0–8.3)

## 2021-12-08 LAB — CBC WITH DIFFERENTIAL/PLATELET
Basophils Absolute: 0.1 10*3/uL (ref 0.0–0.1)
Basophils Relative: 0.8 % (ref 0.0–3.0)
Eosinophils Absolute: 0.1 10*3/uL (ref 0.0–0.7)
Eosinophils Relative: 1 % (ref 0.0–5.0)
HCT: 35.3 % — ABNORMAL LOW (ref 36.0–46.0)
Hemoglobin: 11.5 g/dL — ABNORMAL LOW (ref 12.0–15.0)
Lymphocytes Relative: 29 % (ref 12.0–46.0)
Lymphs Abs: 2.7 10*3/uL (ref 0.7–4.0)
MCHC: 32.5 g/dL (ref 30.0–36.0)
MCV: 88.6 fl (ref 78.0–100.0)
Monocytes Absolute: 0.8 10*3/uL (ref 0.1–1.0)
Monocytes Relative: 8.3 % (ref 3.0–12.0)
Neutro Abs: 5.6 10*3/uL (ref 1.4–7.7)
Neutrophils Relative %: 60.9 % (ref 43.0–77.0)
Platelets: 284 10*3/uL (ref 150.0–400.0)
RBC: 3.99 Mil/uL (ref 3.87–5.11)
RDW: 18 % — ABNORMAL HIGH (ref 11.5–15.5)
WBC: 9.2 10*3/uL (ref 4.0–10.5)

## 2021-12-08 LAB — VITAMIN D 25 HYDROXY (VIT D DEFICIENCY, FRACTURES): VITD: 61.95 ng/mL (ref 30.00–100.00)

## 2021-12-08 MED ORDER — METOPROLOL SUCCINATE ER 50 MG PO TB24: 50.0000 mg | ORAL_TABLET | Freq: Every day | ORAL | 3 refills | Status: DC

## 2021-12-08 NOTE — Patient Instructions (Addendum)
Go to the lab on the way out.   If you have mychart we'll likely use that to update you.    Take care.  Glad to see you.  Stop eliquis and amlodipine.   Update me next week about your BP and pulse and swelling.

## 2021-12-08 NOTE — Progress Notes (Unsigned)
No pain now.  D/w pt about pelvic fx. She tripped getting up on the curb.  ER--->inpatient---->[facility]-----> [facility].  She has a h/o multiple falls.   D/w pt about patient managing her meds.  We agreed about stopping eliquis at this point.    BLE noted, unclear if from amlodipine.

## 2021-12-08 NOTE — Telephone Encounter (Signed)
Bobby at [facility] wanted order for patient to administer her own medication. Call back number [phone].

## 2021-12-09 NOTE — Telephone Encounter (Signed)
Mortimer Fries called back and verbal order was given.

## 2021-12-09 NOTE — Telephone Encounter (Signed)
Please give the order.  Thanks.   

## 2021-12-09 NOTE — Telephone Encounter (Signed)
LMTCB

## 2021-12-10 NOTE — Assessment & Plan Note (Signed)
Discussed stopping amlodipine to see if lower extremity edema gets better.  I asked son to update me next week about blood pressure and pulse and lower extremity edema.

## 2021-12-10 NOTE — Assessment & Plan Note (Signed)
History of, with fracture history noted.  See above about setting up physical therapy.  I asked for her son to check on that.  Check vitamin D and labs today.  I would stop anticoagulation given her history of falls and the risk of continuing anticoagulation likely outweighs the benefit.  Patient and son agree.  30 minutes were devoted to patient care in this encounter (this includes time spent reviewing the patient's file/history, interviewing and examining the patient, counseling/reviewing plan with patient).

## 2022-01-03 ENCOUNTER — Encounter: Payer: Self-pay | Admitting: Family Medicine

## 2022-01-08 ENCOUNTER — Other Ambulatory Visit: Payer: Self-pay | Admitting: Family Medicine

## 2022-01-08 ENCOUNTER — Telehealth: Payer: Self-pay | Admitting: Family Medicine

## 2022-01-08 MED ORDER — HYDROCHLOROTHIAZIDE 12.5 MG PO TABS: 12.5000 mg | ORAL_TABLET | Freq: Every day | ORAL | 1 refills | Status: DC

## 2022-01-08 NOTE — Telephone Encounter (Signed)
Order has been faxed

## 2022-01-08 NOTE — Telephone Encounter (Signed)
error 

## 2022-01-08 NOTE — Telephone Encounter (Signed)
Please send order to stop metoprolol and start HCTZ 12.'5mg'$  a day.  I sent note to her son.  Thanks.

## 2022-02-06 NOTE — Telephone Encounter (Signed)
Please send an order for the patient to administer her own medications.  Thanks.

## 2022-06-15 ENCOUNTER — Telehealth: Payer: Self-pay | Admitting: Family Medicine

## 2022-06-15 NOTE — Telephone Encounter (Signed)
LVM for pt to rtn my call to schedule AWV with [facility].  

## 2022-06-16 ENCOUNTER — Other Ambulatory Visit: Payer: Self-pay | Admitting: Family Medicine

## 2022-06-25 ENCOUNTER — Encounter: Payer: Self-pay | Admitting: Family Medicine

## 2022-06-25 MED ORDER — DONEPEZIL HCL 10 MG PO TABS: 10.0000 mg | ORAL_TABLET | Freq: Every day | ORAL | 1 refills | Status: DC

## 2022-08-24 ENCOUNTER — Ambulatory Visit (INDEPENDENT_AMBULATORY_CARE_PROVIDER_SITE_OTHER): Payer: Medicare Other | Admitting: Family Medicine

## 2022-08-24 ENCOUNTER — Encounter: Payer: Self-pay | Admitting: Family Medicine

## 2022-08-24 VITALS — BP 140/82 | HR 83 | Temp 97.7°F | Ht 64.6 in | Wt 154.0 lb

## 2022-08-24 DIAGNOSIS — R413 Other amnesia: Secondary | ICD-10-CM

## 2022-08-24 DIAGNOSIS — Z0000 Encounter for general adult medical examination without abnormal findings: Secondary | ICD-10-CM

## 2022-08-24 DIAGNOSIS — M810 Age-related osteoporosis without current pathological fracture: Secondary | ICD-10-CM | POA: Diagnosis not present

## 2022-08-24 DIAGNOSIS — I480 Paroxysmal atrial fibrillation: Secondary | ICD-10-CM

## 2022-08-24 DIAGNOSIS — Z7189 Other specified counseling: Secondary | ICD-10-CM

## 2022-08-24 DIAGNOSIS — I10 Essential (primary) hypertension: Secondary | ICD-10-CM | POA: Diagnosis not present

## 2022-08-24 LAB — LIPID PANEL
Cholesterol: 203 mg/dL — ABNORMAL HIGH (ref 0–200)
HDL: 69.8 mg/dL (ref >39.00)
LDL Cholesterol: 113 mg/dL — ABNORMAL HIGH (ref 0–99)
NonHDL: 133.49
Total CHOL/HDL Ratio: 3
Triglycerides: 103 mg/dL (ref 0.0–149.0)
VLDL: 20.6 mg/dL (ref 0.0–40.0)

## 2022-08-24 LAB — CBC WITH DIFFERENTIAL/PLATELET
Basophils Absolute: 0 10*3/uL (ref 0.0–0.1)
Basophils Relative: 0.5 % (ref 0.0–3.0)
Eosinophils Absolute: 0.1 10*3/uL (ref 0.0–0.7)
Eosinophils Relative: 1.4 % (ref 0.0–5.0)
HCT: 38 % (ref 36.0–46.0)
Hemoglobin: 12.8 g/dL (ref 12.0–15.0)
Lymphocytes Relative: 34.9 % (ref 12.0–46.0)
Lymphs Abs: 3.4 10*3/uL (ref 0.7–4.0)
MCHC: 33.8 g/dL (ref 30.0–36.0)
MCV: 88.3 fl (ref 78.0–100.0)
Monocytes Absolute: 0.7 10*3/uL (ref 0.1–1.0)
Monocytes Relative: 7.1 % (ref 3.0–12.0)
Neutro Abs: 5.5 10*3/uL (ref 1.4–7.7)
Neutrophils Relative %: 56.1 % (ref 43.0–77.0)
Platelets: 249 10*3/uL (ref 150.0–400.0)
RBC: 4.3 Mil/uL (ref 3.87–5.11)
RDW: 14.4 % (ref 11.5–15.5)
WBC: 9.8 10*3/uL (ref 4.0–10.5)

## 2022-08-24 LAB — COMPREHENSIVE METABOLIC PANEL
ALT: 15 U/L (ref 0–35)
AST: 23 U/L (ref 0–37)
Albumin: 4.2 g/dL (ref 3.5–5.2)
Alkaline Phosphatase: 58 U/L (ref 39–117)
BUN: 19 mg/dL (ref 6–23)
CO2: 31 mEq/L (ref 19–32)
Calcium: 9.8 mg/dL (ref 8.4–10.5)
Chloride: 91 mEq/L — ABNORMAL LOW (ref 96–112)
Creatinine, Ser: 0.94 mg/dL (ref 0.40–1.20)
GFR: 52.97 mL/min — ABNORMAL LOW (ref >60.00)
Glucose, Bld: 91 mg/dL (ref 70–99)
Potassium: 3.9 mEq/L (ref 3.5–5.1)
Sodium: 131 mEq/L — ABNORMAL LOW (ref 135–145)
Total Bilirubin: 0.4 mg/dL (ref 0.2–1.2)
Total Protein: 6.8 g/dL (ref 6.0–8.3)

## 2022-08-24 LAB — VITAMIN D 25 HYDROXY (VIT D DEFICIENCY, FRACTURES): VITD: 86.07 ng/mL (ref 30.00–100.00)

## 2022-08-24 LAB — VITAMIN B12: Vitamin B-12: 1097 pg/mL — ABNORMAL HIGH (ref 211–911)

## 2022-08-24 LAB — TSH: TSH: 5.13 u[IU]/mL (ref 0.35–5.50)

## 2022-08-24 NOTE — Progress Notes (Unsigned)
I have personally reviewed the Medicare Annual Wellness questionnaire and have noted 1. The patient's medical and social history 2. Their use of alcohol, tobacco or illicit drugs 3. Their current medications and supplements 4. The patient's functional ability including ADL's, fall risks, home safety risks and hearing or visual             impairment. 5. Diet and physical activities 6. Evidence for depression or mood disorders  The patients weight, height, BMI have been recorded in the chart and visual acuity is per eye clinic.  I have made referrals, counseling and provided education to the patient based review of the above and I have provided the pt with a written personalized care plan for preventive services.  Provider list updated- see scanned forms.  Routine anticipatory guidance given to patient.  See health maintenance. The possibility exists that previously documented standard health maintenance information may have been brought forward from a previous encounter into this note.  If needed, that same information has been updated to reflect the current situation based on today's encounter.    Flu 2023 Shingles 2020 PNA 2022 Tetanus 2020 Colon cancer screening not due given her age Breast cancer screening declined. She would not want treatment or intervention. Bone density test deferred by patient 2024 Advance directive-son John designated if patient were incapacitated.  Cognitive function addressed- see scanned forms- and if abnormal then additional documentation follows.   In addition to [facility] [place] [facility], follow up visit for the below conditions:  H/o AF off anticoagulation.  Given her age and fall risk this may be the safest option.  Discussed.    Living at [facility].  Walking with walker.  H/o pelvic fx.  D/w pt about PT but she wanted to defer for now.  She is tying to use an exercise bike at baseline.    Memory d/w pt. still on donepezil at baseline.  She is oriented to the  year and the day but not the month.  She thought it was March instead of April.  She has 2 out of 3 on recall.  She does note some short-term changes over the years.  Hypertension:    Using medication without problems or lightheadedness: yes Chest pain with exertion:no Edema:no Short of breath:no Labs d/w pt.   PMH and SH reviewed  Meds, vitals, and allergies reviewed.   ROS: Per HPI.  Unless specifically indicated otherwise in HPI, the patient denies:  General: fever. Eyes: acute vision changes ENT: sore throat Cardiovascular: chest pain Respiratory: SOB GI: vomiting GU: dysuria Musculoskeletal: acute back pain Derm: acute rash Neuro: acute motor dysfunction Psych: worsening mood Endocrine: polydipsia Heme: bleeding Allergy: hayfever  GEN: nad, alert and oriented except as above. HEENT: mucous membranes moist NECK: supple w/o LA CV: rrr. PULM: ctab, no inc wob ABD: soft, +bs EXT: no edema SKIN: no acute rash  Labs pending.

## 2022-08-24 NOTE — Patient Instructions (Signed)
Go to the lab on the way out.   If you have mychart we'll likely use that to update you.    ?Take care.  Glad to see you. ?Don't change your meds for now.  ?

## 2022-08-26 NOTE — Assessment & Plan Note (Signed)
Flu 2023 Shingles 2020 PNA 2022 Tetanus 2020 Colon cancer screening not due given her age Breast cancer screening declined. She would not want treatment or intervention. Bone density test deferred by patient 2024 Advance directive-son John designated if patient were incapacitated.  Cognitive function addressed- see scanned forms- and if abnormal then additional documentation follows.

## 2022-08-26 NOTE — Assessment & Plan Note (Signed)
Regular rate and rhythm today.  Would defer anticoagulation given her fall risk.

## 2022-08-26 NOTE — Assessment & Plan Note (Signed)
Walking with walker.  H/o pelvic fx.  D/w pt about PT but she wanted to defer for now.  She is tying to use an exercise bike at baseline.  She can update me as needed.  Fall cautions discussed with patient.

## 2022-08-26 NOTE — Assessment & Plan Note (Signed)
No dramatic acute change in mentation at this point.  Would continue donepezil as is.

## 2022-08-26 NOTE — Assessment & Plan Note (Signed)
Continue hydrochlorothiazide at baseline.

## 2022-08-26 NOTE — Assessment & Plan Note (Signed)
Advance directive-son John designated if patient were incapacitated.  

## 2022-09-11 ENCOUNTER — Other Ambulatory Visit: Payer: Self-pay | Admitting: Family Medicine

## 2022-10-05 ENCOUNTER — Encounter: Payer: Self-pay | Admitting: Family Medicine

## 2022-10-05 ENCOUNTER — Telehealth: Payer: Medicare Other | Admitting: Nurse Practitioner

## 2022-10-05 DIAGNOSIS — R3989 Other symptoms and signs involving the genitourinary system: Secondary | ICD-10-CM

## 2022-10-05 NOTE — Progress Notes (Signed)
Because of your age and risk for complications from a UTI, I feel your condition warrants further evaluation and I recommend that you be seen for a face to face visit.  Please contact your primary care physician practice to be seen. Many offices offer virtual options to be seen via video if you are not comfortable going in person to a medical facility at this time.  It would be best for her to see her Primary care doctor tomorrow, if that is not possible she may visit an Urgent Care.   NOTE: You will NOT be charged for this eVisit.  If you do not have a PCP, [place] offers a free physician referral service available at [phone]. Our trained staff has the experience, knowledge and resources to put you in touch with a physician who is right for you.    If you are having a true medical emergency please call 911.   Your e-visit answers were reviewed by a board certified advanced clinical practitioner to complete your personal care plan.  Thank you for using e-Visits.

## 2022-10-06 ENCOUNTER — Encounter: Payer: Self-pay | Admitting: Family Medicine

## 2022-10-06 ENCOUNTER — Ambulatory Visit: Payer: Medicare Other | Admitting: Family Medicine

## 2022-10-06 VITALS — BP 160/80 | HR 94 | Temp 98.4°F | Ht 64.6 in | Wt 156.0 lb

## 2022-10-06 DIAGNOSIS — R3989 Other symptoms and signs involving the genitourinary system: Secondary | ICD-10-CM

## 2022-10-06 LAB — POC URINALSYSI DIPSTICK (AUTOMATED)
Bilirubin, UA: NEGATIVE
Blood, UA: NEGATIVE
Glucose, UA: NEGATIVE
Ketones, UA: NEGATIVE
Nitrite, UA: POSITIVE
Protein, UA: NEGATIVE
Spec Grav, UA: 1.015 (ref 1.010–1.025)
Urobilinogen, UA: 0.2 E.U./dL
pH, UA: 6 (ref 5.0–8.0)

## 2022-10-06 MED ORDER — CEPHALEXIN 500 MG PO CAPS: 500.0000 mg | ORAL_CAPSULE | Freq: Three times a day (TID) | ORAL | 0 refills | Status: DC

## 2022-10-06 NOTE — Patient Instructions (Addendum)
Presumed UTI.    Drink plenty of water and start the antibiotics today.   Take keflex 3 times per day.    We'll contact you with your lab report.  Take care.    Check your BP when you are feeling better and let me know if it is still above 140/90.

## 2022-10-06 NOTE — Progress Notes (Unsigned)
dysuria:yes duration of symptoms: likely weeks.   abdominal pain:no  fevers:no back pain: no vomiting: no D/w pt about trying to drink more water.    Separate issue with hemorrhoids.  Using topical cream with some help.    Meds, vitals, and allergies reviewed.   Per HPI unless specifically indicated in ROS section   GEN: nad, alert and pleasant in conversation HEENT: mucous membranes moist NECK: supple CV: rrr.  PULM: ctab, no inc wob ABD: soft, +bs, suprapubic area tender EXT: no edema SKIN: no acute rash BACK: no CVA pain

## 2022-10-07 DIAGNOSIS — R3989 Other symptoms and signs involving the genitourinary system: Secondary | ICD-10-CM | POA: Insufficient documentation

## 2022-10-07 NOTE — Assessment & Plan Note (Signed)
Presumed UTI.  Discussed options and available data.  Advised to drink plenty of water and start Keflex today.    See notes on labs.  Plan discussed with patient and son. I asked her to check check BP when feeling better and let me know if it is still above 140/90.

## 2022-10-08 LAB — URINE CULTURE
MICRO NUMBER:: 15008547
SPECIMEN QUALITY:: ADEQUATE

## 2022-10-09 ENCOUNTER — Other Ambulatory Visit: Payer: Self-pay | Admitting: Family Medicine

## 2022-10-09 MED ORDER — SULFAMETHOXAZOLE-TRIMETHOPRIM 800-160 MG PO TABS: 1.0000 | ORAL_TABLET | Freq: Two times a day (BID) | ORAL | 0 refills | Status: DC

## 2022-10-13 ENCOUNTER — Other Ambulatory Visit: Payer: Self-pay | Admitting: Family Medicine

## 2022-10-13 MED ORDER — SULFAMETHOXAZOLE-TRIMETHOPRIM 800-160 MG PO TABS: 1.0000 | ORAL_TABLET | Freq: Two times a day (BID) | ORAL | 0 refills | Status: DC

## 2022-11-10 ENCOUNTER — Other Ambulatory Visit: Payer: Self-pay

## 2022-11-16 ENCOUNTER — Other Ambulatory Visit: Payer: Self-pay | Admitting: Family Medicine

## 2022-11-30 ENCOUNTER — Encounter: Payer: Self-pay | Admitting: Family Medicine

## 2022-11-30 ENCOUNTER — Ambulatory Visit (INDEPENDENT_AMBULATORY_CARE_PROVIDER_SITE_OTHER): Payer: Medicare Other | Admitting: Family Medicine

## 2022-11-30 VITALS — BP 168/88 | HR 88 | Temp 97.5°F

## 2022-11-30 DIAGNOSIS — R300 Dysuria: Secondary | ICD-10-CM | POA: Diagnosis not present

## 2022-11-30 DIAGNOSIS — R319 Hematuria, unspecified: Secondary | ICD-10-CM

## 2022-11-30 DIAGNOSIS — R410 Disorientation, unspecified: Secondary | ICD-10-CM

## 2022-11-30 LAB — URINALYSIS, ROUTINE W REFLEX MICROSCOPIC
Bilirubin Urine: NEGATIVE
Ketones, ur: NEGATIVE
Nitrite: NEGATIVE
Specific Gravity, Urine: <=1.005 — AB (ref 1.000–1.030)
Total Protein, Urine: NEGATIVE
Urine Glucose: NEGATIVE
Urobilinogen, UA: 0.2 (ref 0.0–1.0)
pH: 6.5 (ref 5.0–8.0)

## 2022-11-30 LAB — POC URINALSYSI DIPSTICK (AUTOMATED)
Bilirubin, UA: NEGATIVE
Glucose, UA: NEGATIVE
Ketones, UA: NEGATIVE
Leukocytes, UA: NEGATIVE
Nitrite, UA: NEGATIVE
Protein, UA: NEGATIVE
Spec Grav, UA: 1.01 (ref 1.010–1.025)
Urobilinogen, UA: NEGATIVE E.U./dL — AB
pH, UA: 6 (ref 5.0–8.0)

## 2022-11-30 NOTE — Progress Notes (Signed)
Established Patient Office Visit   Subjective  Patient ID: Katrina Armstrong, female    DOB: August 19, 1928  Age: 89 y.o. MRN: [id number]  Chief Complaint  Patient presents with   Urinary Tract Infection    Pt had a fall last week, and today. Has been disoriented.   Pt accompanied by her son.  Pt is a 89 yo female followed by Crawford Givens, MD and seen for acute concern.  Pt with several falls over the last few wks and confusion per reports from staff.  Pt is a resident at [facility].  Pt notes dysuria, difficulty starting stream/decreased UOP x 1 wk.  Pt denies fever, chills, n/v, back pain.  Pt had a UTI  over a month ago that required an additional abx.  Pt unsure if she has ever had any renal calculi.  Urinary Tract Infection     Patient Active Problem List   Diagnosis Date Noted   Suspected UTI 10/07/2022   Mild aortic regurgitation 11/26/2021   Moderate mitral regurgitation 11/26/2021   Pedal edema 11/26/2021   Alzheimer's dementia (HCC) 11/24/2021   Hyperlipidemia 11/24/2021   Wears dentures 11/24/2021   Closed Malgaigne fracture of pelvis (HCC) 11/24/2021   Diverticular disease of colon 11/24/2021   Residual hemorrhoidal skin tags 11/24/2021   Sacral fracture, closed (HCC) 11/24/2021   Second degree hemorrhoids 11/24/2021   Paroxysmal atrial fibrillation (HCC) 09/29/2021   Closed fracture of right pelvis (HCC) 09/22/2021   Allergic rhinitis 08/04/2021   Dysuria 06/11/2021   HTN (hypertension) 01/28/2020   Constipation 07/06/2018   Varicose vein of leg 01/23/2018   Plantar fasciitis of right foot 03/09/2017   Health care maintenance 01/13/2017   Trochanteric bursitis of right hip 01/01/2015   Medicare annual wellness visit, subsequent 12/20/2013   Advance care planning 12/20/2013   Benign paroxysmal positional vertigo 05/05/2013   Dysfunction of eustachian tube 05/05/2013   Fatigue 08/19/2012   GERD (gastroesophageal reflux disease) 08/19/2012   Hyperglycemia  03/28/2012   Vulvar lesion (Seborrheic keratosis) 07/01/2011   Shoulder pain 06/05/2011   Pulmonary embolism (HCC) 12/21/2010   NSTEMI (non-ST elevated myocardial infarction) (HCC) 12/08/2010   HLD (hyperlipidemia) 05/19/2010   Osteoporosis 05/19/2010   Memory loss 05/19/2010   DIVERTICULITIS, HX OF 05/19/2010   History of cardiovascular stress test 06/05/2009   Past Surgical History:  Procedure Laterality Date   APPENDECTOMY     HEMORROIDECTOMY     MASS EXCISION Right 01/31/2016   Procedure: EXCISION MASS RIGHT LOWER LIP;  Surgeon: Linus Salmons, MD;  Location: [facility];  Service: ENT;  Laterality: Right;   Social History   Tobacco Use   Smoking status: Former    Current packs/day: 0.00    Types: Cigarettes    Quit date: 05/11/1978    Years since quitting: 44.5   Smokeless tobacco: Never  Vaping Use   Vaping status: Never Used  Substance Use Topics   Alcohol use: No   Drug use: No   Family History  Problem Relation Age of Onset   Hypertension Mother    Dementia Mother    Melanoma Father    Hypertension Other        fam hx   Stroke Other        sibling   Other Other        PNA- sibling/brain tumor- sibling   Cancer Sister        brain tumor   Breast cancer Neg Hx    Colon cancer  Neg Hx    Allergies  Allergen Reactions   Boniva [Ibandronate Sodium]     Lack of effect   Raloxifene     Stopped due to DVT/PE 12/2010      ROS Negative unless stated above    Objective:     BP (!) 168/88 (BP Location: Left Arm, Patient Position: Sitting, Cuff Size: Normal)   Pulse 88   Temp (!) 97.5 F (36.4 C) (Oral)   SpO2 94%    Physical Exam Constitutional:      General: She is not in acute distress.    Appearance: Normal appearance.     Comments: HOH.  HENT:     Head: Normocephalic and atraumatic.     Nose: Nose normal.     Mouth/Throat:     Mouth: Mucous membranes are moist.  Cardiovascular:     Rate and Rhythm: Normal rate and regular rhythm.      Heart sounds: Normal heart sounds. No murmur heard.    No gallop.  Pulmonary:     Effort: Pulmonary effort is normal. No respiratory distress.     Breath sounds: Normal breath sounds. No wheezing, rhonchi or rales.  Abdominal:     General: Bowel sounds are normal.     Tenderness: There is no abdominal tenderness.  Skin:    General: Skin is warm and dry.  Neurological:     General: No focal deficit present.     Mental Status: She is alert.      Results for orders placed or performed in visit on 11/30/22  POCT Urinalysis Dipstick (Automated)  Result Value Ref Range   Color, UA yellow    Clarity, UA clear    Glucose, UA Negative Negative   Bilirubin, UA neg    Ketones, UA neg    Spec Grav, UA 1.010 1.010 - 1.025   Blood, UA 1+    pH, UA 6.0 5.0 - 8.0   Protein, UA Negative Negative   Urobilinogen, UA negative (A) 0.2 or 1.0 E.U./dL   Nitrite, UA neg    Leukocytes, UA Negative Negative      Assessment & Plan:  Dysuria -     POCT Urinalysis Dipstick (Automated) -     Urine Culture  Disoriented -     POCT Urinalysis Dipstick (Automated) -     Urine Culture -     CBC with Differential/Platelet -     Basic metabolic panel  Hematuria, unspecified type -     Urine Culture -     Urinalysis, Routine w reflex microscopic  POC UA with 1+ RBCs, otherwise negative.  Will obtain UCx.  Obtain CBC and BMP to further evaluate symptoms.  Given strict precautions. F/u with pcp  Return if symptoms worsen or fail to improve.   Deeann Saint, MD

## 2022-12-01 ENCOUNTER — Telehealth: Payer: Self-pay | Admitting: *Deleted

## 2022-12-01 LAB — CBC WITH DIFFERENTIAL/PLATELET
Basophils Absolute: 0.1 10*3/uL (ref 0.0–0.1)
Basophils Relative: 0.6 % (ref 0.0–3.0)
Eosinophils Absolute: 0.1 10*3/uL (ref 0.0–0.7)
Eosinophils Relative: 0.4 % (ref 0.0–5.0)
HCT: 37.7 % (ref 36.0–46.0)
Hemoglobin: 12.5 g/dL (ref 12.0–15.0)
Lymphocytes Relative: 30.1 % (ref 12.0–46.0)
Lymphs Abs: 3.6 10*3/uL (ref 0.7–4.0)
MCHC: 33 g/dL (ref 30.0–36.0)
MCV: 88.2 fl (ref 78.0–100.0)
Monocytes Absolute: 0.7 10*3/uL (ref 0.1–1.0)
Monocytes Relative: 5.7 % (ref 3.0–12.0)
Neutro Abs: 7.6 10*3/uL (ref 1.4–7.7)
Neutrophils Relative %: 63.2 % (ref 43.0–77.0)
Platelets: 308 10*3/uL (ref 150.0–400.0)
RBC: 4.27 Mil/uL (ref 3.87–5.11)
RDW: 14.6 % (ref 11.5–15.5)
WBC: 12.1 10*3/uL — ABNORMAL HIGH (ref 4.0–10.5)

## 2022-12-01 LAB — URINE CULTURE
MICRO NUMBER:: 15229481
SPECIMEN QUALITY:: ADEQUATE

## 2022-12-01 LAB — BASIC METABOLIC PANEL
BUN: 16 mg/dL (ref 6–23)
CO2: 27 mEq/L (ref 19–32)
Calcium: 10.1 mg/dL (ref 8.4–10.5)
Chloride: 81 mEq/L — ABNORMAL LOW (ref 96–112)
Creatinine, Ser: 0.88 mg/dL (ref 0.40–1.20)
GFR: 57.22 mL/min — ABNORMAL LOW (ref >60.00)
Glucose, Bld: 99 mg/dL (ref 70–99)
Potassium: 3.3 mEq/L — ABNORMAL LOW (ref 3.5–5.1)
Sodium: 119 mEq/L — CL (ref 135–145)

## 2022-12-01 NOTE — Telephone Encounter (Signed)
CRITICAL VALUE STICKER  CRITICAL VALUE: Sodium-119  RECEIVER (on-site recipient of call):Katrina Armstrong  DATE & TIME NOTIFIED: 12/01/2022 at 11:54am  MESSENGER (representative from lab):Katrina Armstrong with the [facility] lab  MD NOTIFIED: Dr [place] as Dr Salomon Fick is out of the office  TIME OF NOTIFICATION:11:54am  RESPONSE:

## 2022-12-01 NOTE — Telephone Encounter (Signed)
Spoke with the patient's son, Jonny Ruiz and informed him of the message below.  He stated the patient was taken to the ER last night and has been admitted.

## 2022-12-01 NOTE — Telephone Encounter (Signed)
Pt needs to be taken to the ER for hyponatremia. It seems like she was seen yesterday for MS changes, which could be caused by low sodium Thanks, BJ

## 2022-12-09 ENCOUNTER — Encounter: Payer: Self-pay | Admitting: Family Medicine

## 2022-12-27 ENCOUNTER — Encounter: Payer: Self-pay | Admitting: Family Medicine

## 2022-12-27 DIAGNOSIS — R300 Dysuria: Secondary | ICD-10-CM

## 2022-12-28 NOTE — Telephone Encounter (Signed)
Please send order for u/a and ucx.  Dx dysuria.  R30.0.

## 2023-01-03 ENCOUNTER — Other Ambulatory Visit: Payer: Self-pay | Admitting: Family Medicine

## 2023-01-06 ENCOUNTER — Other Ambulatory Visit: Payer: Self-pay | Admitting: Family Medicine

## 2023-01-06 MED ORDER — FOSFOMYCIN TROMETHAMINE 3 G PO PACK: 3.0000 g | PACK | ORAL | 0 refills | Status: DC

## 2023-02-22 DIAGNOSIS — I351 Nonrheumatic aortic (valve) insufficiency: Secondary | ICD-10-CM

## 2023-03-01 ENCOUNTER — Emergency Department (HOSPITAL_COMMUNITY): Payer: Medicare Other

## 2023-03-01 ENCOUNTER — Other Ambulatory Visit: Payer: Self-pay

## 2023-03-01 ENCOUNTER — Inpatient Hospital Stay (HOSPITAL_COMMUNITY)
Admission: EM | Admit: 2023-03-01 | Discharge: 2023-03-05 | DRG: 641 | Disposition: A | Discharge status: Skilled Nursing Facility | Payer: Medicare Other | Source: Skilled Nursing Facility | Attending: Internal Medicine | Admitting: Internal Medicine

## 2023-03-01 ENCOUNTER — Telehealth: Payer: Self-pay

## 2023-03-01 ENCOUNTER — Encounter (HOSPITAL_COMMUNITY): Payer: Self-pay

## 2023-03-01 DIAGNOSIS — Z8249 Family history of ischemic heart disease and other diseases of the circulatory system: Secondary | ICD-10-CM

## 2023-03-01 DIAGNOSIS — Z66 Do not resuscitate: Secondary | ICD-10-CM | POA: Diagnosis present

## 2023-03-01 DIAGNOSIS — I160 Hypertensive urgency: Secondary | ICD-10-CM | POA: Diagnosis present

## 2023-03-01 DIAGNOSIS — Z823 Family history of stroke: Secondary | ICD-10-CM | POA: Diagnosis not present

## 2023-03-01 DIAGNOSIS — R9431 Abnormal electrocardiogram [ECG] [EKG]: Secondary | ICD-10-CM | POA: Diagnosis not present

## 2023-03-01 DIAGNOSIS — E785 Hyperlipidemia, unspecified: Secondary | ICD-10-CM | POA: Diagnosis present

## 2023-03-01 DIAGNOSIS — I252 Old myocardial infarction: Secondary | ICD-10-CM

## 2023-03-01 DIAGNOSIS — R296 Repeated falls: Secondary | ICD-10-CM | POA: Diagnosis present

## 2023-03-01 DIAGNOSIS — H9193 Unspecified hearing loss, bilateral: Secondary | ICD-10-CM | POA: Diagnosis present

## 2023-03-01 DIAGNOSIS — K219 Gastro-esophageal reflux disease without esophagitis: Secondary | ICD-10-CM | POA: Diagnosis present

## 2023-03-01 DIAGNOSIS — Z818 Family history of other mental and behavioral disorders: Secondary | ICD-10-CM

## 2023-03-01 DIAGNOSIS — S0003XA Contusion of scalp, initial encounter: Principal | ICD-10-CM | POA: Diagnosis present

## 2023-03-01 DIAGNOSIS — Y92128 Other place in nursing home as the place of occurrence of the external cause: Secondary | ICD-10-CM

## 2023-03-01 DIAGNOSIS — M810 Age-related osteoporosis without current pathological fracture: Secondary | ICD-10-CM | POA: Diagnosis present

## 2023-03-01 DIAGNOSIS — R32 Unspecified urinary incontinence: Secondary | ICD-10-CM | POA: Diagnosis present

## 2023-03-01 DIAGNOSIS — Z808 Family history of malignant neoplasm of other organs or systems: Secondary | ICD-10-CM | POA: Diagnosis not present

## 2023-03-01 DIAGNOSIS — W19XXXA Unspecified fall, initial encounter: Secondary | ICD-10-CM

## 2023-03-01 DIAGNOSIS — E876 Hypokalemia: Secondary | ICD-10-CM | POA: Diagnosis not present

## 2023-03-01 DIAGNOSIS — Z87891 Personal history of nicotine dependence: Secondary | ICD-10-CM | POA: Diagnosis not present

## 2023-03-01 DIAGNOSIS — I10 Essential (primary) hypertension: Secondary | ICD-10-CM | POA: Diagnosis present

## 2023-03-01 DIAGNOSIS — G4089 Other seizures: Secondary | ICD-10-CM | POA: Diagnosis present

## 2023-03-01 DIAGNOSIS — G309 Alzheimer's disease, unspecified: Secondary | ICD-10-CM | POA: Diagnosis present

## 2023-03-01 DIAGNOSIS — F02C0 Dementia in other diseases classified elsewhere, severe, without behavioral disturbance, psychotic disturbance, mood disturbance, and anxiety: Secondary | ICD-10-CM | POA: Diagnosis present

## 2023-03-01 DIAGNOSIS — Z79899 Other long term (current) drug therapy: Secondary | ICD-10-CM

## 2023-03-01 DIAGNOSIS — I480 Paroxysmal atrial fibrillation: Secondary | ICD-10-CM | POA: Diagnosis present

## 2023-03-01 DIAGNOSIS — Z28310 Unvaccinated for covid-19: Secondary | ICD-10-CM | POA: Diagnosis not present

## 2023-03-01 DIAGNOSIS — Z86711 Personal history of pulmonary embolism: Secondary | ICD-10-CM | POA: Diagnosis not present

## 2023-03-01 DIAGNOSIS — W010XXA Fall on same level from slipping, tripping and stumbling without subsequent striking against object, initial encounter: Secondary | ICD-10-CM | POA: Diagnosis present

## 2023-03-01 DIAGNOSIS — E871 Hypo-osmolality and hyponatremia: Principal | ICD-10-CM | POA: Diagnosis present

## 2023-03-01 LAB — CBC WITH DIFFERENTIAL/PLATELET
Abs Immature Granulocytes: 0.05 10*3/uL (ref 0.00–0.07)
Basophils Absolute: 0.1 10*3/uL (ref 0.0–0.1)
Basophils Relative: 1 %
Eosinophils Absolute: 0.2 10*3/uL (ref 0.0–0.5)
Eosinophils Relative: 2 %
HCT: 33.2 % — ABNORMAL LOW (ref 36.0–46.0)
Hemoglobin: 11.5 g/dL — ABNORMAL LOW (ref 12.0–15.0)
Immature Granulocytes: 1 %
Lymphocytes Relative: 29 %
Lymphs Abs: 3.1 10*3/uL (ref 0.7–4.0)
MCH: 29 pg (ref 26.0–34.0)
MCHC: 34.6 g/dL (ref 30.0–36.0)
MCV: 83.8 fL (ref 80.0–100.0)
Monocytes Absolute: 0.8 10*3/uL (ref 0.1–1.0)
Monocytes Relative: 8 %
Neutro Abs: 6.3 10*3/uL (ref 1.7–7.7)
Neutrophils Relative %: 59 %
Platelets: 238 10*3/uL (ref 150–400)
RBC: 3.96 MIL/uL (ref 3.87–5.11)
RDW: 14.1 % (ref 11.5–15.5)
WBC: 10.5 10*3/uL (ref 4.0–10.5)
nRBC: 0 % (ref 0.0–0.2)

## 2023-03-01 LAB — BASIC METABOLIC PANEL
Anion gap: 13 (ref 5–15)
BUN: 13 mg/dL (ref 8–23)
CO2: 20 mmol/L — ABNORMAL LOW (ref 22–32)
Calcium: 8.6 mg/dL — ABNORMAL LOW (ref 8.9–10.3)
Chloride: 81 mmol/L — ABNORMAL LOW (ref 98–111)
Creatinine, Ser: 0.79 mg/dL (ref 0.44–1.00)
GFR, Estimated: >60 mL/min (ref >60)
Glucose, Bld: 122 mg/dL — ABNORMAL HIGH (ref 70–99)
Potassium: 3.3 mmol/L — ABNORMAL LOW (ref 3.5–5.1)
Sodium: 114 mmol/L — CL (ref 135–145)

## 2023-03-01 LAB — CBG MONITORING, ED: Glucose-Capillary: 148 mg/dL — ABNORMAL HIGH (ref 70–99)

## 2023-03-01 MED ORDER — LORAZEPAM 2 MG/ML IJ SOLN
INTRAMUSCULAR | Status: AC
  Filled 2023-03-01: qty 1

## 2023-03-01 MED ORDER — SODIUM CHLORIDE 0.9 % IV BOLUS
250.0000 mL | Freq: Once | INTRAVENOUS | Status: AC
  Administered 2023-03-01: 250 mL via INTRAVENOUS

## 2023-03-01 MED ORDER — ACETAMINOPHEN 500 MG PO TABS
1000.0000 mg | ORAL_TABLET | Freq: Once | ORAL | Status: AC
  Administered 2023-03-01: 1000 mg via ORAL
  Filled 2023-03-01: qty 2

## 2023-03-01 MED ORDER — SODIUM CHLORIDE 3 % IV SOLN: INTRAVENOUS | Status: DC

## 2023-03-01 MED ORDER — POTASSIUM CHLORIDE CRYS ER 20 MEQ PO TBCR
40.0000 meq | EXTENDED_RELEASE_TABLET | Freq: Once | ORAL | Status: DC
  Filled 2023-03-01: qty 2

## 2023-03-01 MED ORDER — SODIUM CHLORIDE 3 % IV SOLN
600.0000 mL/h | Freq: Once | INTRAVENOUS | Status: AC
  Administered 2023-03-01: 600 mL/h via INTRAVENOUS
  Filled 2023-03-01: qty 500

## 2023-03-01 MED ORDER — LORAZEPAM 2 MG/ML IJ SOLN: 1.0000 mg | Freq: Once | INTRAMUSCULAR | Status: DC

## 2023-03-01 NOTE — Progress Notes (Signed)
Orthopedic Tech Progress Note Patient Details:  Katrina Armstrong June 16, 1928 [id number] Level 2 Trauma. Not needed Patient ID: [name], female   DOB: [birth year], 89 y.o.   MRN: [id number]  Lovett Calender 03/01/2023, 5:26 PM

## 2023-03-01 NOTE — ED Notes (Signed)
Pt now alert but disoriented. Pt had loss of bladder control when unresponsive. Pt cleaned and linens changed.

## 2023-03-01 NOTE — ED Provider Notes (Addendum)
[place] EMERGENCY DEPARTMENT AT [facility] Provider Note   CSN: [id number] Arrival date & time: 03/01/23  1703     History  Chief Complaint  Patient presents with   Fall on thinners    Level II    Katrina Armstrong is a 89 y.o. female.  Patient presents after witnessed fall at the nursing home following backwards hitting her head primarily.  Patient's at baseline, hard of hearing.  No fevers or infectious symptoms recently.  No other pain or signs of injury in extremities.  Patient denies any abdominal chest or back pain or injury.  Patient had normal sugar on route.  Blood pressure elevated on route 200/over 96.  The history is provided by the patient and the EMS personnel.       Home Medications Prior to Admission medications   Medication Sig Start Date End Date Taking? Authorizing Provider  acetaminophen (TYLENOL) 500 MG tablet Take 1,000 mg by mouth 3 (three) times daily as needed for mild pain, fever or headache.   Yes [provider]  amLODipine (NORVASC) 5 MG tablet Take 5 mg by mouth in the morning and at bedtime.   Yes [provider]  cyanocobalamin 1000 MCG tablet Take 1,000 mcg by mouth daily.   Yes [provider]  donepezil (ARICEPT) 10 MG tablet TAKE 1 TABLET BY MOUTH EVERY DAY 01/04/23  Yes Joaquim Nam, MD  hydrochlorothiazide (HYDRODIURIL) 12.5 MG tablet TAKE 1 TABLET BY MOUTH EVERY DAY 09/11/22  Yes Joaquim Nam, MD  losartan (COZAAR) 50 MG tablet Take 50 mg by mouth daily.   Yes [provider]  Multiple Vitamins-Minerals (MULTIVITAMIN ADULTS 50+ PO) Take by mouth daily.   Yes [provider]  polyethylene glycol powder (GLYCOLAX/MIRALAX) powder Take 17 g by mouth daily as needed for mild constipation. 07/05/18  Yes Joaquim Nam, MD  Cholecalciferol (VITAMIN D) 2000 UNITS CAPS Take 1 capsule by mouth daily. Patient not taking: Reported on 03/01/2023    [provider]  fosfomycin  (MONUROL) 3 g PACK Take 3 g by mouth every 3 (three) days. For 3 doses. Patient not taking: Reported on 03/01/2023 01/06/23   Joaquim Nam, MD  Nutritional Supplements (CARNATION INSTANT BREAKFAST) LIQD Take 1 packet by mouth 3 (three) times daily. Patient not taking: Reported on 03/01/2023    [provider]      Allergies    Boniva [ibandronate sodium] and Raloxifene    Review of Systems   Review of Systems  Unable to perform ROS: Acuity of condition    Physical Exam Updated Vital Signs BP (!) 154/82 (BP Location: Left Arm)   Pulse 91   Temp 97.6 F (36.4 C) (Oral)   Resp 20   SpO2 93%  Physical Exam Vitals and nursing note reviewed.  Constitutional:      General: She is not in acute distress.    Appearance: She is well-developed.  HENT:     Head: Normocephalic.     Comments: Patient has 3 cm scalp hematoma without bleeding posterior upper scalp.  Neck supple, no significant tenderness or step-off midline.    Mouth/Throat:     Mouth: Mucous membranes are moist.  Eyes:     General:        Right eye: No discharge.        Left eye: No discharge.     Conjunctiva/sclera: Conjunctivae normal.  Neck:     Trachea: No tracheal deviation.  Cardiovascular:  Rate and Rhythm: Normal rate and regular rhythm.  Pulmonary:     Effort: Pulmonary effort is normal.     Breath sounds: Normal breath sounds.  Abdominal:     General: There is no distension.     Palpations: Abdomen is soft.     Tenderness: There is no abdominal tenderness. There is no guarding.  Musculoskeletal:        General: No swelling, tenderness, deformity or signs of injury.     Cervical back: Normal range of motion and neck supple. No rigidity or tenderness.  Skin:    General: Skin is warm.     Capillary Refill: Capillary refill takes less than 2 seconds.     Findings: No rash.  Neurological:     General: No focal deficit present.     Mental Status: She is alert.     Comments: Cranial nerves  intact no facial droop.  Equal strength all extremities.  Patient very hard of hearing.  Pupils equal extraocular muscle function intact.  Psychiatric:        Mood and Affect: Mood normal.     ED Results / Procedures / Treatments   Labs (all labs ordered are listed, but only abnormal results are displayed) Labs Reviewed  BASIC METABOLIC PANEL - Abnormal; Notable for the following components:      Result Value   Sodium 114 (*)    Potassium 3.3 (*)    Chloride 81 (*)    CO2 20 (*)    Glucose, Bld 122 (*)    Calcium 8.6 (*)    All other components within normal limits  CBC WITH DIFFERENTIAL/PLATELET - Abnormal; Notable for the following components:   Hemoglobin 11.5 (*)    HCT 33.2 (*)    All other components within normal limits  URINALYSIS, ROUTINE W REFLEX MICROSCOPIC  SODIUM, URINE, RANDOM  OSMOLALITY, URINE  BASIC METABOLIC PANEL  MAGNESIUM    EKG None  Radiology CT Head Wo Contrast  Result Date: 03/01/2023 CLINICAL DATA:  Fall while on blood thinners EXAM: CT HEAD WITHOUT CONTRAST CT CERVICAL SPINE WITHOUT CONTRAST TECHNIQUE: Multidetector CT imaging of the head and cervical spine was performed following the standard protocol without intravenous contrast. Multiplanar CT image reconstructions of the cervical spine were also generated. RADIATION DOSE REDUCTION: This exam was performed according to the departmental dose-optimization program which includes automated exposure control, adjustment of the mA and/or kV according to patient size and/or use of iterative reconstruction technique. COMPARISON:  None Available. FINDINGS: CT HEAD FINDINGS Brain: There is no mass, hemorrhage or extra-axial collection. The size and configuration of the ventricles and extra-axial CSF spaces are normal. There is hypoattenuation of the periventricular white matter, most commonly indicating chronic ischemic microangiopathy. Vascular: No abnormal hyperdensity of the major intracranial arteries or  dural venous sinuses. No intracranial atherosclerosis. Skull: Left parietal scalp hematoma.  No skull fracture. Sinuses/Orbits: No fluid levels or advanced mucosal thickening of the visualized paranasal sinuses. No mastoid or middle ear effusion. The orbits are normal. CT CERVICAL SPINE FINDINGS Alignment: No static subluxation. Facets are aligned. Occipital condyles are normally positioned. Skull base and vertebrae: No acute fracture. Soft tissues and spinal canal: No prevertebral fluid or swelling. No visible canal hematoma. Disc levels: No advanced spinal canal or neural foraminal stenosis. Upper chest: No pneumothorax, pulmonary nodule or pleural effusion. Other: Normal visualized paraspinal cervical soft tissues. IMPRESSION: 1. Left parietal scalp hematoma without skull fracture or acute intracranial abnormality. 2. No acute fracture or static  subluxation of the cervical spine. 3. Chronic ischemic microangiopathy. Electronically Signed   By: Deatra Robinson M.D.   On: 03/01/2023 20:54   CT Cervical Spine Wo Contrast  Result Date: 03/01/2023 CLINICAL DATA:  Fall while on blood thinners EXAM: CT HEAD WITHOUT CONTRAST CT CERVICAL SPINE WITHOUT CONTRAST TECHNIQUE: Multidetector CT imaging of the head and cervical spine was performed following the standard protocol without intravenous contrast. Multiplanar CT image reconstructions of the cervical spine were also generated. RADIATION DOSE REDUCTION: This exam was performed according to the departmental dose-optimization program which includes automated exposure control, adjustment of the mA and/or kV according to patient size and/or use of iterative reconstruction technique. COMPARISON:  None Available. FINDINGS: CT HEAD FINDINGS Brain: There is no mass, hemorrhage or extra-axial collection. The size and configuration of the ventricles and extra-axial CSF spaces are normal. There is hypoattenuation of the periventricular white matter, most commonly indicating  chronic ischemic microangiopathy. Vascular: No abnormal hyperdensity of the major intracranial arteries or dural venous sinuses. No intracranial atherosclerosis. Skull: Left parietal scalp hematoma.  No skull fracture. Sinuses/Orbits: No fluid levels or advanced mucosal thickening of the visualized paranasal sinuses. No mastoid or middle ear effusion. The orbits are normal. CT CERVICAL SPINE FINDINGS Alignment: No static subluxation. Facets are aligned. Occipital condyles are normally positioned. Skull base and vertebrae: No acute fracture. Soft tissues and spinal canal: No prevertebral fluid or swelling. No visible canal hematoma. Disc levels: No advanced spinal canal or neural foraminal stenosis. Upper chest: No pneumothorax, pulmonary nodule or pleural effusion. Other: Normal visualized paraspinal cervical soft tissues. IMPRESSION: 1. Left parietal scalp hematoma without skull fracture or acute intracranial abnormality. 2. No acute fracture or static subluxation of the cervical spine. 3. Chronic ischemic microangiopathy. Electronically Signed   By: Deatra Robinson M.D.   On: 03/01/2023 20:54   DG Pelvis Portable  Result Date: 03/01/2023 CLINICAL DATA:  Trauma, witnessed fall. EXAM: PORTABLE PELVIS 1-2 VIEWS COMPARISON:  Pelvic CT 09/22/2021. FINDINGS: There are remote right superior and inferior pubic rami fractures. Remote right iliac fracture. The screw traverses both sacroiliac joints. There is a screw traversing the right iliac bone. The right iliac screw extends into the soft tissues by 10 mm. There is a screw that projects over the soft tissues of the left upper inner thigh. This is not within bone. There is no evidence of acute fracture. No hip dislocation. IMPRESSION: 1. No acute fracture of the pelvis. 2. Remote right superior and inferior pubic rami fractures. Remote right iliac fracture. 3. Screw traversing the right iliac bone. The right iliac screw extends into the soft tissues by 10 mm.  Sacroiliac fusion with single screw. 4. Screw projects over the soft tissues of the left upper inner thigh. This does not appear to be within bone. Electronically Signed   By: Narda Rutherford M.D.   On: 03/01/2023 20:39   DG CHEST PORT 1 VIEW  Result Date: 03/01/2023 CLINICAL DATA:  89 year old post fall. EXAM: PORTABLE CHEST 1 VIEW COMPARISON:  Radiograph 02/22/2023 FINDINGS: Stable cardiomegaly. Stable aortic tortuosity. Aortic atherosclerosis. No pneumothorax or large pleural effusion. Mild atelectasis at the left lung base. No convincing pulmonary edema. No displaced fracture. IMPRESSION: Stable cardiomegaly. Mild left basilar atelectasis. Electronically Signed   By: Narda Rutherford M.D.   On: 03/01/2023 20:37    Procedures .Critical Care  Performed by: Blane Ohara, MD Authorized by: Blane Ohara, MD   Critical care provider statement:    Critical care time (minutes):  80  Critical care start time:  03/01/2023 8:50 PM   Critical care end time:  03/01/2023 10:10 PM   Critical care time was exclusive of:  Separately billable procedures and treating other patients and teaching time   Critical care was necessary to treat or prevent imminent or life-threatening deterioration of the following conditions:  Trauma and metabolic crisis   Critical care was time spent personally by me on the following activities:  Pulse oximetry, ordering and review of radiographic studies, ordering and review of laboratory studies, discussions with consultants, evaluation of patient's response to treatment and re-evaluation of patient's condition     Medications Ordered in ED Medications  acetaminophen (TYLENOL) tablet 1,000 mg (1,000 mg Oral Given 03/01/23 1849)  sodium chloride 0.9 % bolus 250 mL ( Intravenous Stopped 03/01/23 2111)    ED Course/ Medical Decision Making/ A&P                                 Medical Decision Making Amount and/or Complexity of Data Reviewed Labs:  ordered. Radiology: ordered.  Risk OTC drugs. Prescription drug management. Decision regarding hospitalization.   Patient presents from nursing home with EMS for witnessed mechanical fall.  No clinical signs or symptoms of infection or significant anemia.  With age and difficulty hearing plan for screening labs to check for anemia, electrolyte or kidney function abnormalities.  Patient primary injury is head and with age and patient on Plavix plan for CT head and neck without contrast.  Discussed with trauma nurse at bedside and EMS at bedside.  Tylenol ordered for pain.  Patient level 2 trauma.  Patient general blood work independently reviewed with significant concerns sodium 114, potassium 3.3.  IV fluid bolus 250 cc of sodium ordered.  Urine awesome's, sodium pending.  Patient had sodium of 119 on 7/22 reviewed medical record.  No seizure activity in the ER. CT scan delayed however results independently reviewed no acute fracture or bleeding, hematoma seen.  Chest x-ray and pelvic x-ray reviewed no acute fractures.  Discussed with hospitalist recommended critical care admission, discussed with Dr. Briant Sites for admission, no beds at this time will board in the ER and monitor and follow sodiums.  While waiting for critical care bed patient began having seizure-like activity.  This improved she was diaphoretic.  Discussed with pharmacy for 3% normal saline until symptoms continue to resolve.  Paged critical care to update them.  Bed pending.  Patient care signed out to continue to monitor until patient admitted.       Final Clinical Impression(s) / ED Diagnoses Final diagnoses:  Scalp hematoma, initial encounter  Fall, initial encounter  Hyponatremia  Hypokalemia    Rx / DC Orders ED Discharge Orders     None         Blane Ohara, MD 03/01/23 1610    Blane Ohara, MD 03/01/23 2311

## 2023-03-01 NOTE — ED Triage Notes (Signed)
Pt [facility] for a witnessed fall, no LOC, takes plavix, mechanical fall.   BP 200/96 CBG 121 98.4 HR 82

## 2023-03-01 NOTE — Telephone Encounter (Signed)
Noted.  Will await ER report.  ?

## 2023-03-01 NOTE — ED Notes (Signed)
Trauma Response Nurse Documentation  Katrina Armstrong is a 89 y.o. female arriving to [facility] ED via EMS  On clopidogrel 75 mg daily. Trauma was activated as a Level 2 based on the following trauma criteria Elderly patients > 65 with head trauma on anti-coagulation (excluding ASA).  Patient cleared for CT by Dr. Jodi Mourning. Pt transported to CT with trauma response nurse present to monitor. RN remained with the patient throughout their absence from the department for clinical observation. GCS 14.  History   Past Medical History:  Diagnosis Date   Acute bronchitis 08/04/2021   Advance care planning 12/20/2013   Advance directive- son Jonny Ruiz designated if patient were incapacitated.    Allergic rhinitis 08/04/2021   Alzheimer's dementia (HCC)    Benign paroxysmal positional vertigo 05/05/2013   Closed fracture of right pelvis (HCC) 09/22/2021   Closed Malgaigne fracture of pelvis (HCC) 11/24/2021   Constipation 07/06/2018   COVID-19 vaccination declined 07/28/2020   Diverticular disease of colon 11/24/2021   Dysfunction of eustachian tube 05/05/2013   Dysuria 06/11/2021   Fatigue 08/19/2012   GERD (gastroesophageal reflux disease)    Health care maintenance 01/13/2017   History of cardiovascular stress test 06/05/2009   [facility]- [facility]  Echo- mild AS/mod LVH. Myocardial perfusion imaging is normal. Overall left ventricular function is normal without regional wall motion abnormalities.   HLD (hyperlipidemia) 05/19/2010   2013- Pt was concerned about myalgias with statin.  She declined further treatment with statin.  Continue diet and exercise.     HTN (hypertension) 01/28/2020   Hyperglycemia 03/28/2012   Hyperlipidemia    Medicare annual wellness visit, subsequent 12/20/2013   Memory loss    NSTEMI (non-ST elevated myocardial infarction) (HCC) 12/08/2010   Admission to [facility] 12/08/10 with positive troponin but no sig coronary obstruction on cath 12/09/10.  Occurred during setting of PE.    Osteoporosis  05/19/2010   lack of response to Boniva, started Evista 12/11, stopped 8/12 with PE Improved on DXA 01/2012   Paroxysmal atrial fibrillation (HCC) 09/29/2021   Personal history of other diseases of digestive system    Plantar fasciitis of right foot 03/09/2017   Pulmonary embolism (HCC)    12/2010   Residual hemorrhoidal skin tags 11/24/2021   Sacral fracture, closed (HCC) 11/24/2021   Second degree hemorrhoids 11/24/2021   Shoulder pain 06/05/2011   Trochanteric bursitis of right hip 01/01/2015   Varicose vein of leg 01/23/2018   Vulvar lesion (Seborrheic keratosis) 07/01/2011   Treated with TCA in the office 06/16/11, 07/01/11 and 07/15/11   Wears dentures    full upper and lower     Past Surgical History:  Procedure Laterality Date   APPENDECTOMY     HEMORROIDECTOMY     MASS EXCISION Right 01/31/2016   Procedure: EXCISION MASS RIGHT LOWER LIP;  Surgeon: Linus Salmons, MD;  Location: [facility];  Service: ENT;  Laterality: Right;     Initial Focused Assessment (If applicable, or please see trauma documentation): Patient alert, baseline dementia, able to tell me her name but otherwise disoriented, VERY HOH-better in R ear Airway intact, bilateral breath sounds Pulses 2+ Hematoma to head  CT's Completed:   CT Head and CT C-Spine   Interventions:  IV, labs CXR/PXR CT Head/Cspine  Plan for disposition:  Admission to floor   Event Summary: Patient to ED after an unwitnessed fall at [place]. Hematoma to head and takes Plavix. Imaging revealed no traumatic injury, basic lab work revealed Na 114. Patient previously  with hyponatremia, will most likely need admission for correction.  Bedside handoff with ED RN Angelica Chessman.    Jill Side Harrington Jobe  Trauma Response RN  Please call TRN at [phone] for further assistance.

## 2023-03-01 NOTE — Telephone Encounter (Signed)
[facility] med tech with [facility] in [place] said that pt fell while walking in hallway at facility. No one actually saw the fall. Vivi Ferns said pt said she tripped.now BP 188/100 P 100 pt is shaky and complaining of back pain.per Vivi Ferns pt fell backwards and not sure if pt hit her head or lost consciousness. Darby said BP now is 188/100 P 100 they have called EMS to take pt to ED. Rosemary Holms FNP is now listed as PCP but Vivi Ferns said Dr Para March is still pts PCP., sending note to Dr Para March and Para March pool.

## 2023-03-01 NOTE — ED Notes (Signed)
Pt found with agonal breathing, diaphoretic and not responding. EDP at bedside.

## 2023-03-01 NOTE — Progress Notes (Signed)
   03/01/23 1720  Spiritual Encounters  Type of Visit Initial  Care provided to: Pt not available  Referral source Trauma page  Reason for visit Trauma  OnCall Visit No   Chaplain responded to a level two trauma. The patient was attended to by the medical team.  No family is present. If a chaplain is requested someone will respond.   Valerie Roys [facility]  [phone]

## 2023-03-02 ENCOUNTER — Inpatient Hospital Stay (HOSPITAL_COMMUNITY): Payer: Medicare Other

## 2023-03-02 DIAGNOSIS — R9431 Abnormal electrocardiogram [ECG] [EKG]: Secondary | ICD-10-CM | POA: Diagnosis not present

## 2023-03-02 DIAGNOSIS — E871 Hypo-osmolality and hyponatremia: Secondary | ICD-10-CM | POA: Diagnosis not present

## 2023-03-02 LAB — ECHOCARDIOGRAM COMPLETE
AR max vel: 1.34 cm2
AV Area VTI: 1.35 cm2
AV Area mean vel: 1.28 cm2
AV Mean grad: 10 mm[Hg]
AV Peak grad: 18.5 mm[Hg]
Ao pk vel: 2.15 m/s
Area-P 1/2: 4.36 cm2
P 1/2 time: 699 ms
S' Lateral: 1.8 cm
Weight: 2366.86 [oz_av]

## 2023-03-02 LAB — URINALYSIS, ROUTINE W REFLEX MICROSCOPIC
Bilirubin Urine: NEGATIVE
Glucose, UA: >=500 mg/dL — AB
Hgb urine dipstick: NEGATIVE
Ketones, ur: 20 mg/dL — AB
Leukocytes,Ua: NEGATIVE
Nitrite: NEGATIVE
Protein, ur: NEGATIVE mg/dL
Specific Gravity, Urine: 1.005 (ref 1.005–1.030)
pH: 7 (ref 5.0–8.0)

## 2023-03-02 LAB — BASIC METABOLIC PANEL
Anion gap: 17 — ABNORMAL HIGH (ref 5–15)
Anion gap: 14 (ref 5–15)
BUN: 11 mg/dL (ref 8–23)
BUN: 7 mg/dL — ABNORMAL LOW (ref 8–23)
CO2: 18 mmol/L — ABNORMAL LOW (ref 22–32)
CO2: 23 mmol/L (ref 22–32)
Calcium: 8.8 mg/dL — ABNORMAL LOW (ref 8.9–10.3)
Calcium: 9 mg/dL (ref 8.9–10.3)
Chloride: 84 mmol/L — ABNORMAL LOW (ref 98–111)
Chloride: 89 mmol/L — ABNORMAL LOW (ref 98–111)
Creatinine, Ser: 0.77 mg/dL (ref 0.44–1.00)
Creatinine, Ser: 0.56 mg/dL (ref 0.44–1.00)
GFR, Estimated: >60 mL/min (ref >60)
GFR, Estimated: >60 mL/min (ref >60)
Glucose, Bld: 142 mg/dL — ABNORMAL HIGH (ref 70–99)
Glucose, Bld: 84 mg/dL (ref 70–99)
Potassium: 3.5 mmol/L (ref 3.5–5.1)
Potassium: 3.5 mmol/L (ref 3.5–5.1)
Sodium: 119 mmol/L — CL (ref 135–145)
Sodium: 126 mmol/L — ABNORMAL LOW (ref 135–145)

## 2023-03-02 LAB — CBC
HCT: 34.3 % — ABNORMAL LOW (ref 36.0–46.0)
Hemoglobin: 11.9 g/dL — ABNORMAL LOW (ref 12.0–15.0)
MCH: 28.6 pg (ref 26.0–34.0)
MCHC: 34.7 g/dL (ref 30.0–36.0)
MCV: 82.5 fL (ref 80.0–100.0)
Platelets: 265 10*3/uL (ref 150–400)
RBC: 4.16 MIL/uL (ref 3.87–5.11)
RDW: 13.9 % (ref 11.5–15.5)
WBC: 9.5 10*3/uL (ref 4.0–10.5)
nRBC: 0 % (ref 0.0–0.2)

## 2023-03-02 LAB — OSMOLALITY, URINE: Osmolality, Ur: 289 mosm/kg — ABNORMAL LOW (ref 300–900)

## 2023-03-02 LAB — MAGNESIUM
Magnesium: 1.7 mg/dL (ref 1.7–2.4)
Magnesium: 1.8 mg/dL (ref 1.7–2.4)

## 2023-03-02 LAB — GLUCOSE, CAPILLARY
Glucose-Capillary: 135 mg/dL — ABNORMAL HIGH (ref 70–99)
Glucose-Capillary: 82 mg/dL (ref 70–99)
Glucose-Capillary: 83 mg/dL (ref 70–99)
Glucose-Capillary: 78 mg/dL (ref 70–99)
Glucose-Capillary: 86 mg/dL (ref 70–99)

## 2023-03-02 LAB — MRSA NEXT GEN BY PCR, NASAL: MRSA by PCR Next Gen: NOT DETECTED

## 2023-03-02 LAB — OSMOLALITY: Osmolality: 251 mosm/kg — ABNORMAL LOW (ref 275–295)

## 2023-03-02 LAB — SODIUM
Sodium: 119 mmol/L — CL (ref 135–145)
Sodium: 130 mmol/L — ABNORMAL LOW (ref 135–145)
Sodium: 130 mmol/L — ABNORMAL LOW (ref 135–145)
Sodium: 131 mmol/L — ABNORMAL LOW (ref 135–145)

## 2023-03-02 LAB — PHOSPHORUS: Phosphorus: 2.5 mg/dL (ref 2.5–4.6)

## 2023-03-02 LAB — SODIUM, URINE, RANDOM: Sodium, Ur: 81 mmol/L

## 2023-03-02 MED ORDER — INSULIN ASPART 100 UNIT/ML IJ SOLN
0.0000 [IU] | INTRAMUSCULAR | Status: DC
  Administered 2023-03-02: 1 [IU] via SUBCUTANEOUS

## 2023-03-02 MED ORDER — POLYETHYLENE GLYCOL 3350 17 G PO PACK: 17.0000 g | PACK | Freq: Every day | ORAL | Status: DC | PRN

## 2023-03-02 MED ORDER — DOCUSATE SODIUM 100 MG PO CAPS: 100.0000 mg | ORAL_CAPSULE | Freq: Two times a day (BID) | ORAL | Status: DC | PRN

## 2023-03-02 MED ORDER — CHLORHEXIDINE GLUCONATE CLOTH 2 % EX PADS
6.0000 | MEDICATED_PAD | Freq: Every day | CUTANEOUS | Status: DC
Start: 2023-03-02 — End: 2023-03-05
  Administered 2023-03-02 – 2023-03-05 (×4): 6 via TOPICAL

## 2023-03-02 MED ORDER — HYDRALAZINE HCL 20 MG/ML IJ SOLN: 10.0000 mg | INTRAMUSCULAR | Status: DC | PRN

## 2023-03-02 MED ORDER — BISACODYL 10 MG RE SUPP
10.0000 mg | Freq: Once | RECTAL | Status: AC
  Administered 2023-03-02: 10 mg via RECTAL
  Filled 2023-03-02: qty 1

## 2023-03-02 NOTE — Progress Notes (Signed)
eLink Physician-Brief Progress Note Patient Name: Katrina Armstrong DOB: Mar 16, 1929 MRN: [id number]   Date of Service  03/02/2023  HPI/Events of Note  89 y.o. female with a history of afib off anticoagulation, hypertension, short term memory loss that had a witnessed fall at her nursing home, hit her head and had hypertensive emergency en route.  Hypertensive into the 160s, 95% on 2 L.  Laboratory studies show severe hyponatremia 114, mild electrolyte disturbances, mild anemia  No intracranial bleeding.  Large hematoma noted.  Stable chest radiograph  eICU Interventions  Status post hypertonic saline bolus.,  Frequent sodium checks.  Hold home hydrochlorothiazide  May need to initiate antihypertensives  SCDs for DVT prophylaxis GI prophylaxis not indicated     Intervention Category Evaluation Type: New Patient Evaluation  Katrina Armstrong 03/02/2023, 12:47 AM

## 2023-03-02 NOTE — Plan of Care (Signed)

## 2023-03-02 NOTE — H&P (Signed)
NAME:  Katrina Armstrong, MRN:  [id number], DOB:  22-Jun-1928, LOS: 1 ADMISSION DATE:  03/01/2023 CONSULTATION DATE:  03/01/2023 REFERRING MD:  Jodi Mourning - EDP CHIEF COMPLAINT:  Hyponatremia   History of Present Illness:  89 year old woman who presented to [facility] ED 10/21 after an unwitnessed fall at her nursing home [facility] [place] PMHx significant for HTN, HLD, mild AS, PAF (not on [facility]), PE (12/2010), NSTEMI (2012, on Plavix), GERD, diverticular disease, Alzheimer's dementia, severe HOH with Eustachian tube dysfunction. Seen at PCP's office in 11/2022 for confusion and more frequent falls, found to have Na 119.  Patient is a poor historian due to extreme [facility], therefore history has primarily been obtained from chart review. Per facility staff, patient was ambulating in the hallway when she had an unwitnessed fall; patient says that she tripped. Unknown if patient struck her head or had LOC. Post-fall, patient was hypertensive, shaky and c/o back pain.   On ED arrival, patient was afebrile, HR 91, BP 154/82, RR 20, SpO2 93%. Labs were notable for WBC 10.5, Hgb 11.5, Plt 238. Na 114 (119 3 months PTA, 131 6 months PTA), K 3.3, CO2 20, Cr 0.79. Serum/urine Osm and urine Na pending. CXR with stable cardiomegaly, mild L basilar atelectasis. CT Head with L parietal scalp hematoma without skull fracture/AICA, chronic ischemic microangiopathy. CT C-Spine with no acute fracture/subluxation of C-spine. XR Pelvis negative for acute fracture  PCCM consulted for ICU admission. While awaiting ICU bed, patient became minimally responsive, diaphoretic and had incontinence of bladder with concern for seizure-like activity. HTS 3% bolus was given with hypertonic saline protocol in place. Transferred to [facility] ICU.  Pertinent Medical History:   Past Medical History:  Diagnosis Date   Acute bronchitis 08/04/2021   Advance care planning 12/20/2013   Advance directive- son Jonny Ruiz designated if patient were incapacitated.     Allergic rhinitis 08/04/2021   Alzheimer's dementia (HCC)    Benign paroxysmal positional vertigo 05/05/2013   Closed fracture of right pelvis (HCC) 09/22/2021   Closed Malgaigne fracture of pelvis (HCC) 11/24/2021   Constipation 07/06/2018   COVID-19 vaccination declined 07/28/2020   Diverticular disease of colon 11/24/2021   Dysfunction of eustachian tube 05/05/2013   Dysuria 06/11/2021   Fatigue 08/19/2012   GERD (gastroesophageal reflux disease)    Health care maintenance 01/13/2017   History of cardiovascular stress test 06/05/2009   [facility]- [facility]  Echo- mild AS/mod LVH. Myocardial perfusion imaging is normal. Overall left ventricular function is normal without regional wall motion abnormalities.   HLD (hyperlipidemia) 05/19/2010   2013- Pt was concerned about myalgias with statin.  She declined further treatment with statin.  Continue diet and exercise.     HTN (hypertension) 01/28/2020   Hyperglycemia 03/28/2012   Hyperlipidemia    Medicare annual wellness visit, subsequent 12/20/2013   Memory loss    NSTEMI (non-ST elevated myocardial infarction) (HCC) 12/08/2010   Admission to [facility] 12/08/10 with positive troponin but no sig coronary obstruction on cath 12/09/10.  Occurred during setting of PE.    Osteoporosis 05/19/2010   lack of response to Boniva, started Evista 12/11, stopped 8/12 with PE Improved on DXA 01/2012   Paroxysmal atrial fibrillation (HCC) 09/29/2021   Personal history of other diseases of digestive system    Plantar fasciitis of right foot 03/09/2017   Pulmonary embolism (HCC)    12/2010   Residual hemorrhoidal skin tags 11/24/2021   Sacral fracture, closed (HCC) 11/24/2021   Second degree hemorrhoids 11/24/2021  Shoulder pain 06/05/2011   Trochanteric bursitis of right hip 01/01/2015   Varicose vein of leg 01/23/2018   Vulvar lesion (Seborrheic keratosis) 07/01/2011   Treated with TCA in the office 06/16/11, 07/01/11 and 07/15/11   Wears dentures    full upper and lower   Significant  Hospital Events: Including procedures, antibiotic start and stop dates in addition to other pertinent events   10/21 - Presented to [facility] ED after an unwitnessed fall at [facility]. Found to be hyponatremic to 114. CT Head/C-spine negative with exception of L parietal scalp hematoma. PCCM consulted for admission. Brief episode of unresponsiveness, diaphoresis and bladder incontinence while awaiting ICU bed, HTS 3% bolus given in ED.  Interim History / Subjective:  PCCM consulted for ICU admission.  Objective:  Blood pressure (!) 163/68, pulse 85, temperature (!) (P) 96.6 F (35.9 C), temperature source (P) Axillary, resp. rate (!) 23, SpO2 95%.        Intake/Output Summary (Last 24 hours) at 03/02/2023 0039 Last data filed at 03/01/2023 2331 Gross per 24 hour  Intake 352.63 ml  Output --  Net 352.63 ml   There were no vitals filed for this visit.  Physical Examination: General: Chronically ill-appearing elderly woman in NAD. Extremely HOH. HEENT: [place]/AT, anicteric sclera, PERRL, moist mucous membranes. +Dentures in place. Neuro:  Awake, unable to assess orientation due to severe difficulty hearing.  Responds to verbal stimuli. Following commands consistently (when not limited by hearing). Moves all 4 extremities spontaneously. Strength equal bilaterally without focal neurologic deficits. CV: RRR, no m/g/r. PULM: Breathing even and unlabored on 2LNC. Lung fields CTAB. GI: Soft, nontender, mildly distended. Hypoactive bowel sounds. Extremities: Bilateral symmetric trace LE edema noted. Skin: Warm/dry, no rashes.  Resolved Hospital Problem List:    Assessment & Plan:  Hyponatremia with concern for brief seizure-like activity Fall on Plavix without significant traumatic injury Presented after a fall at SNF, unwitnessed. Per patient, fall was mechanical in nature. Evaluated by Trauma on admission, no significant traumatic injuries with exception of L parietal scalp hematoma. CT Head/C-Spine  NAICA. - Admit to ICU for close monitoring - Episode of ?seizure-like activity/decreased responsiveness in ED prompting HTS - S/p HTS 3% bolus x 1 - Continue HTS per protocol; pharmacy following, appreciate assistance - Trend Na Q2H, then Q4H - F/u serum Osm, urine Osm, urine Na - Hold home HCTZ  HTN with hypertensive urgency on admission HLD NSTEMI (2012, on Plavix) - Resume home medications (Norvasc, Losartan) - HOLD home hydrochlorothiazide in the setting of hypoNa - Hold home Plavix for now, resume as appropriate  PAF (not on [facility]) History of PE (12/2010) - Cardiac monitoring - Optimize electrolytes for K > 4, Mg > 2 - Not on  AC, risk > benefit - F/u Echo  GERD Diverticular disease - Bowel regimen - Not on PPI per med rec  Alzheimer's dementia Severe HOH with Eustachian tube dysfunction - Resume home Aricept  Best Practice: (right click and "Reselect all SmartList Selections" daily)   Diet/type: Regular consistency (see orders) DVT prophylaxis: SCDs GI prophylaxis: N/A Lines: N/A Foley:  N/A Code Status:  full code - DNR per form in chart dated 09/2021, want to confirm with family prior to code status change as patient is too HOH to confirm Last date of multidisciplinary goals of care discussion [Pending]  Labs:  CBC: Recent Labs  Lab 03/01/23 1713  WBC 10.5  NEUTROABS 6.3  HGB 11.5*  HCT 33.2*  MCV 83.8  PLT 238  Basic Metabolic Panel: Recent Labs  Lab 03/01/23 1713  NA 114*  K 3.3*  CL 81*  CO2 20*  GLUCOSE 122*  BUN 13  CREATININE 0.79  CALCIUM 8.6*   GFR: CrCl cannot be calculated (Unknown ideal weight.). Recent Labs  Lab 03/01/23 1713  WBC 10.5   Liver Function Tests: No results for input(s): "AST", "ALT", "ALKPHOS", "BILITOT", "PROT", "ALBUMIN" in the last 168 hours. No results for input(s): "LIPASE", "AMYLASE" in the last 168 hours. No results for input(s): "AMMONIA" in the last 168 hours.  ABG:    Component Value Date/Time    TCO2 24 12/14/2011 0914    Coagulation Profile: No results for input(s): "INR", "PROTIME" in the last 168 hours.  Cardiac Enzymes: No results for input(s): "CKTOTAL", "CKMB", "CKMBINDEX", "TROPONINI" in the last 168 hours.  HbA1C: Hgb A1c MFr Bld  Date/Time Value Ref Range Status  06/10/2021 03:21 PM 5.9 4.6 - 6.5 % Final    Comment:    Glycemic Control Guidelines for People with Diabetes:Non Diabetic:  <6%Goal of Therapy: <7%Additional Action Suggested:  >8%   01/23/2020 07:37 AM 6.0 4.6 - 6.5 % Final    Comment:    Glycemic Control Guidelines for People with Diabetes:Non Diabetic:  <6%Goal of Therapy: <7%Additional Action Suggested:  >8%    CBG: Recent Labs  Lab 03/01/23 2258  GLUCAP 148*   Review of Systems:   Review of systems completed with pertinent positives/negatives outlined in above HPI.  Past Medical History:  She,  has a past medical history of Acute bronchitis (08/04/2021), Advance care planning (12/20/2013), Allergic rhinitis (08/04/2021), Alzheimer's dementia (HCC), Benign paroxysmal positional vertigo (05/05/2013), Closed fracture of right pelvis (HCC) (09/22/2021), Closed Malgaigne fracture of pelvis (HCC) (11/24/2021), Constipation (07/06/2018), COVID-19 vaccination declined (07/28/2020), Diverticular disease of colon (11/24/2021), Dysfunction of eustachian tube (05/05/2013), Dysuria (06/11/2021), Fatigue (08/19/2012), GERD (gastroesophageal reflux disease), Health care maintenance (01/13/2017), History of cardiovascular stress test (06/05/2009), HLD (hyperlipidemia) (05/19/2010), HTN (hypertension) (01/28/2020), Hyperglycemia (03/28/2012), Hyperlipidemia, Medicare annual wellness visit, subsequent (12/20/2013), Memory loss, NSTEMI (non-ST elevated myocardial infarction) (HCC) (12/08/2010), Osteoporosis (05/19/2010), Paroxysmal atrial fibrillation (HCC) (09/29/2021), Personal history of other diseases of digestive system, Plantar fasciitis of right foot (03/09/2017), Pulmonary embolism  (HCC), Residual hemorrhoidal skin tags (11/24/2021), Sacral fracture, closed (HCC) (11/24/2021), Second degree hemorrhoids (11/24/2021), Shoulder pain (06/05/2011), Trochanteric bursitis of right hip (01/01/2015), Varicose vein of leg (01/23/2018), Vulvar lesion (Seborrheic keratosis) (07/01/2011), and Wears dentures.   Surgical History:   Past Surgical History:  Procedure Laterality Date   APPENDECTOMY     HEMORROIDECTOMY     MASS EXCISION Right 01/31/2016   Procedure: EXCISION MASS RIGHT LOWER LIP;  Surgeon: Linus Salmons, MD;  Location: [facility];  Service: ENT;  Laterality: Right;    Social History:   reports that she quit smoking about 44 years ago. Her smoking use included cigarettes. She has never used smokeless tobacco. She reports that she does not drink alcohol and does not use drugs.   Family History:  Her family history includes Cancer in her sister; Dementia in her mother; Hypertension in her mother and another family member; Melanoma in her father; Other in an other family member; Stroke in an other family member. There is no history of Breast cancer or Colon cancer.   Allergies: Allergies  Allergen Reactions   Boniva [Ibandronate Sodium]     Lack of effect   Raloxifene     Stopped due to DVT/PE 12/2010    Home Medications: Prior to Admission medications  Medication Sig Start Date End Date Taking? Authorizing Provider  acetaminophen (TYLENOL) 500 MG tablet Take 1,000 mg by mouth 3 (three) times daily as needed for mild pain, fever or headache.   Yes [provider]  amLODipine (NORVASC) 5 MG tablet Take 5 mg by mouth in the morning and at bedtime.   Yes [provider]  cyanocobalamin 1000 MCG tablet Take 1,000 mcg by mouth daily.   Yes [provider]  donepezil (ARICEPT) 10 MG tablet TAKE 1 TABLET BY MOUTH EVERY DAY 01/04/23  Yes Joaquim Nam, MD  hydrochlorothiazide (HYDRODIURIL) 12.5 MG tablet TAKE 1 TABLET BY MOUTH EVERY DAY 09/11/22   Yes Joaquim Nam, MD  losartan (COZAAR) 50 MG tablet Take 50 mg by mouth daily.   Yes [provider]  Multiple Vitamins-Minerals (MULTIVITAMIN ADULTS 50+ PO) Take by mouth daily.   Yes [provider]  polyethylene glycol powder (GLYCOLAX/MIRALAX) powder Take 17 g by mouth daily as needed for mild constipation. 07/05/18  Yes Joaquim Nam, MD  Cholecalciferol (VITAMIN D) 2000 UNITS CAPS Take 1 capsule by mouth daily. Patient not taking: Reported on 03/01/2023    [provider]  fosfomycin (MONUROL) 3 g PACK Take 3 g by mouth every 3 (three) days. For 3 doses. Patient not taking: Reported on 03/01/2023 01/06/23   Joaquim Nam, MD  Nutritional Supplements (CARNATION INSTANT BREAKFAST) LIQD Take 1 packet by mouth 3 (three) times daily. Patient not taking: Reported on 03/01/2023    [provider]    Critical care time:   The patient is critically ill with multiple organ system failure and requires high complexity decision making for assessment and support, frequent evaluation and titration of therapies, advanced monitoring, review of radiographic studies and interpretation of complex data.   Critical Care Time devoted to patient care services, exclusive of separately billable procedures, described in this note is 38 minutes.  Tim Lair, PA-C [facility] 03/02/23 12:39 AM  Please see [email or web address] for pager details.  From 7A-7P if no response, please call [phone] After hours, please call ELink [phone]

## 2023-03-02 NOTE — Progress Notes (Signed)
PCCM Interval Progress Note  Signed out by night team to follow up on repeat labs.   Admitted for AMS and fall at nursing home. Found to be hyponatremic with admission Na 114. Received 3% NS around 2300 lat night and a 250cc NS bolus.  F/u Na this AM 126.  BP (!) 153/75   Pulse 75   Temp 98.1 F (36.7 C) (Oral)   Resp 16   Wt 67.1 kg   SpO2 92%   BMI 24.92 kg/m   She is alert when she can hear you (EXTREMELY HOH). She is oriented to person and place but not time. She follows basic commands and moves all extremities.  Na with a bit of faster climb then ideal, but pt is awake, stable. We will let her self regulate. Start PO diet. Follow repeat labs. Ok to transfer out of ICU to [facility] [employer]. Will ask TRH to pickup in AM 10/23 with PCCM off.  Confirmed DNR/DNI with her son, Katrina Armstrong over the phone this AM.   Rutherford Guys, PA - C [facility] For pager details, please see AMION or use Epic chat  After 1900, please call [facility] for cross coverage needs 03/02/2023, 11:23 AM

## 2023-03-02 NOTE — Progress Notes (Signed)
PT Cancellation Note  Patient Details Name: SHAREEKA CERNOSEK MRN: [id number] DOB: 22-Nov-1928   Cancelled Treatment:    Reason Eval/Treat Not Completed: Patient at procedure or test/unavailable PT orders received, chart reviewed. Pt noted to be off the floor at a procedure at this time. Will f/u as able.  Aleda Grana, PT, DPT 03/02/23, 3:29 PM    Sandi Mariscal 03/02/2023, 3:29 PM

## 2023-03-03 DIAGNOSIS — E871 Hypo-osmolality and hyponatremia: Secondary | ICD-10-CM | POA: Diagnosis not present

## 2023-03-03 LAB — GLUCOSE, CAPILLARY
Glucose-Capillary: 87 mg/dL (ref 70–99)
Glucose-Capillary: 94 mg/dL (ref 70–99)
Glucose-Capillary: 61 mg/dL — ABNORMAL LOW (ref 70–99)
Glucose-Capillary: 148 mg/dL — ABNORMAL HIGH (ref 70–99)
Glucose-Capillary: 105 mg/dL — ABNORMAL HIGH (ref 70–99)
Glucose-Capillary: 93 mg/dL (ref 70–99)

## 2023-03-03 LAB — MAGNESIUM: Magnesium: 2.1 mg/dL (ref 1.7–2.4)

## 2023-03-03 LAB — BASIC METABOLIC PANEL
Anion gap: 10 (ref 5–15)
BUN: 8 mg/dL (ref 8–23)
CO2: 24 mmol/L (ref 22–32)
Calcium: 9.1 mg/dL (ref 8.9–10.3)
Chloride: 94 mmol/L — ABNORMAL LOW (ref 98–111)
Creatinine, Ser: 0.73 mg/dL (ref 0.44–1.00)
GFR, Estimated: >60 mL/min (ref >60)
Glucose, Bld: 80 mg/dL (ref 70–99)
Potassium: 3 mmol/L — ABNORMAL LOW (ref 3.5–5.1)
Sodium: 128 mmol/L — ABNORMAL LOW (ref 135–145)

## 2023-03-03 LAB — CBC
HCT: 36 % (ref 36.0–46.0)
Hemoglobin: 12.3 g/dL (ref 12.0–15.0)
MCH: 28.6 pg (ref 26.0–34.0)
MCHC: 34.2 g/dL (ref 30.0–36.0)
MCV: 83.7 fL (ref 80.0–100.0)
Platelets: 280 10*3/uL (ref 150–400)
RBC: 4.3 MIL/uL (ref 3.87–5.11)
RDW: 14.3 % (ref 11.5–15.5)
WBC: 8.9 10*3/uL (ref 4.0–10.5)
nRBC: 0 % (ref 0.0–0.2)

## 2023-03-03 LAB — PHOSPHORUS: Phosphorus: 2.5 mg/dL (ref 2.5–4.6)

## 2023-03-03 MED ORDER — VITAMIN B-12 1000 MCG PO TABS
1000.0000 ug | ORAL_TABLET | Freq: Every day | ORAL | Status: DC
  Administered 2023-03-03 – 2023-03-05 (×3): 1000 ug via ORAL
  Filled 2023-03-03 (×3): qty 1

## 2023-03-03 MED ORDER — POTASSIUM CHLORIDE CRYS ER 20 MEQ PO TBCR
40.0000 meq | EXTENDED_RELEASE_TABLET | Freq: Two times a day (BID) | ORAL | Status: AC
  Administered 2023-03-03 – 2023-03-04 (×3): 40 meq via ORAL
  Filled 2023-03-03 (×3): qty 2

## 2023-03-03 MED ORDER — DONEPEZIL HCL 10 MG PO TABS
10.0000 mg | ORAL_TABLET | Freq: Every day | ORAL | Status: DC
  Administered 2023-03-03 – 2023-03-04 (×2): 10 mg via ORAL
  Filled 2023-03-03 (×3): qty 1

## 2023-03-03 MED ORDER — ONDANSETRON HCL 4 MG/2ML IJ SOLN: 4.0000 mg | Freq: Four times a day (QID) | INTRAMUSCULAR | Status: DC | PRN

## 2023-03-03 MED ORDER — ACETAMINOPHEN 325 MG PO TABS
650.0000 mg | ORAL_TABLET | Freq: Four times a day (QID) | ORAL | Status: DC | PRN
  Administered 2023-03-03: 650 mg via ORAL
  Filled 2023-03-03: qty 2

## 2023-03-03 NOTE — TOC CAGE-AID Note (Signed)
Transition of Care [facility]) - CAGE-AID Screening   Patient Details  Name: Katrina Armstrong MRN: [id number] Date of Birth: [birth year]  Transition of Care [facility] CM/SW Contact:    Leota Sauers, RN Phone Number: 03/03/2023, 5:58 AM   Clinical Narrative:  No use of alcohol or illicit substances. Resources not given at this time.  CAGE-AID Screening:    Have You Ever Felt You Ought to Cut Down on Your Drinking or Drug Use?: No Have People Annoyed You By Critizing Your Drinking Or Drug Use?: No Have You Felt Bad Or Guilty About Your Drinking Or Drug Use?: No Have You Ever Had a Drink or Used Drugs First Thing In The Morning to Steady Your Nerves or to Get Rid of a Hangover?: No CAGE-AID Score: 0  Substance Abuse Education Offered: No

## 2023-03-03 NOTE — Evaluation (Signed)
Occupational Therapy Evaluation Patient Details Name: Katrina Armstrong MRN: [id number] DOB: 01-24-1929 Today's Date: 03/03/2023   History of Present Illness 89 year old woman who presented to [facility] ED 10/21 after an unwitnessed fall at her nursing home [name]). PMHx significant for HTN, HLD, mild AS, PAF (not on [facility]), PE (12/2010), NSTEMI (2012, on Plavix), GERD, diverticular disease, Alzheimer's dementia, severe HOH with Eustachian tube dysfunction. Seen at PCP's office in 11/2022 for confusion and more frequent falls, found to have Na 119.   Clinical Impression   Pt admitted with the above diagnosis. Pt currently with functional limitations due to the deficits listed below (see OT Problem List). Prior to admit, pt was living at ALF in [place] using RW for functional mobility and receiving assistance PRN for BADL tasks. Pt experiencing frequent falls in 1 month time frame. Patient will benefit from continued inpatient follow up therapy, <3 hours/day. Pt will benefit from acute skilled OT to increase their safety and independence with ADL and functional mobility for ADL to facilitate discharge. OT will continue to follow acutely.        If plan is discharge home, recommend the following: A little help with walking and/or transfers;A lot of help with bathing/dressing/bathroom    Functional Status Assessment  Patient has had a recent decline in their functional status and demonstrates the ability to make significant improvements in function in a reasonable and predictable amount of time.  Equipment Recommendations  Other (comment) (defer to next venue of care)       Precautions / Restrictions Precautions Precautions: Fall Precaution Comments: hx of falls, HOH, seizures, [place] Restrictions Weight Bearing Restrictions: No      Mobility Bed Mobility Overal bed mobility: Needs Assistance Bed Mobility: Supine to Sit, Sit to Supine     Supine to sit: Contact guard, HOB  elevated Sit to supine: Min assist   General bed mobility comments: Increased time to complete transition. Able to complete with VC for sequencing and task initiation.    Transfers Overall transfer level: Needs assistance Equipment used: Rolling walker (2 wheels) Transfers: Sit to/from Stand, Bed to chair/wheelchair/BSC Sit to Stand: Min assist, From elevated surface     Step pivot transfers: Min assist     General transfer comment: VC for hand placement with RW management during sit to stand transitions. Pt was able to walk from recliner to sink and also bed to toilet during session utilizing RW. Pt demonstrated occassional posterior lean when standing initially stating that sometimes it takes her a second to get moving forward.      Balance Overall balance assessment: Needs assistance Sitting-balance support: Bilateral upper extremity supported, Feet supported Sitting balance-Leahy Scale: Fair Sitting balance - Comments: sitting on toilet   Standing balance support: Bilateral upper extremity supported, During functional activity, Reliant on assistive device for balance Standing balance-Leahy Scale: Poor Standing balance comment: requires UE support and initially physical assist to maintain balance when first standing and attempting to walk forwards with RW.        ADL either performed or assessed with clinical judgement   ADL Overall ADL's : Needs assistance/impaired Eating/Feeding: Set up;Sitting   Grooming: Wash/dry hands;Oral care;Supervision/safety;Standing;Wash/dry face;Set up Grooming Details (indicate cue type and reason): standing at sink to rinse dentures and wash hands. Upper Body Bathing: Set up;Sitting   Lower Body Bathing: Moderate assistance;Sit to/from stand   Upper Body Dressing : Minimal assistance;Sitting   Lower Body Dressing: Total assistance;Sit to/from stand   Toilet Transfer: Minimal assistance;Cueing  for sequencing;Cueing for  safety;Ambulation;Regular Toilet;Grab bars;Rolling walker (2 wheels) Toilet Transfer Details (indicate cue type and reason): Pt attempted to reach for grab bar along wall by toilet as soon as she entered bathroom. Required max verbal and tactile cues to replace hand on RW handle to prevent falling. Toileting- Clothing Manipulation and Hygiene: Total assistance;Sit to/from stand               Vision Baseline Vision/History: 0 No visual deficits Ability to See in Adequate Light: 0 Adequate Patient Visual Report: No change from baseline Vision Assessment?: No apparent visual deficits     Perception Perception: Not tested       Praxis Praxis: Not tested       Pertinent Vitals/Pain Pain Assessment Pain Assessment: No/denies pain     Extremity/Trunk Assessment Upper Extremity Assessment Upper Extremity Assessment: Generalized weakness   Lower Extremity Assessment Lower Extremity Assessment: Defer to PT evaluation   Cervical / Trunk Assessment Cervical / Trunk Assessment: Kyphotic   Communication Communication Communication: Hearing impairment Cueing Techniques: Verbal cues;Tactile cues;Visual cues   Cognition Arousal: Alert Behavior During Therapy: WFL for tasks assessed/performed Overall Cognitive Status: History of cognitive impairments - at baseline            General Comments: Very HOH, Oriented to person. Repeated questions she already asked and had been provided an answer to. Able to follow 1 step commands.     General Comments  Telemonitor service indicated that pt's HR increased during activity per nursing. Unable to observe HR during session.            Home Living Family/patient expects to be discharged to:: Assisted living       Home Equipment: Rolling Walker (2 wheels);Rollator (4 wheels)          Prior Functioning/Environment Prior Level of Function : Needs assist;History of Falls (last six months)  Mobility Comments: walking independently  in ALF with RW, ADLs Comments: Received assistance as needed for BADL tasks.        OT Problem List: Decreased strength;Impaired balance (sitting and/or standing)      OT Treatment/Interventions: Self-care/ADL training;Therapeutic exercise;Therapeutic activities;DME and/or AE instruction;Patient/family education;Balance training;Manual therapy    OT Goals(Current goals can be found in the care plan section) Acute Rehab OT Goals Patient Stated Goal: to go to the bathroom OT Goal Formulation: Patient unable to participate in goal setting Time For Goal Achievement: 03/18/23 Potential to Achieve Goals: Fair  OT Frequency: Min 1X/week       AM-PAC OT "6 Clicks" Daily Activity     Outcome Measure Help from another person eating meals?: A Little Help from another person taking care of personal grooming?: A Little Help from another person toileting, which includes using toliet, bedpan, or urinal?: A Lot Help from another person bathing (including washing, rinsing, drying)?: A Lot Help from another person to put on and taking off regular upper body clothing?: A Little Help from another person to put on and taking off regular lower body clothing?: Total 6 Click Score: 14   End of Session Equipment Utilized During Treatment: Gait belt;Rolling walker (2 wheels) Nurse Communication: Mobility status  Activity Tolerance: Patient tolerated treatment well Patient left: with nursing/sitter in room;Other (comment) (on toilet in bathroom)  OT Visit Diagnosis: Unsteadiness on feet (R26.81);Repeated falls (R29.6);Muscle weakness (generalized) (M62.81);History of falling (Z91.81)                Time: 1610-9604 OT Time Calculation (min): 41 min Charges:  OT General Charges $OT Visit: 1 Visit OT Evaluation $OT Eval High Complexity: 1 High OT Treatments $Self Care/Home Management : 8-22 mins  Limmie Patricia, OTR/L,CBIS  Supplemental OT - [facility] and [facility] Secure Chat Preferred    Abra Lingenfelter,  Charisse March 03/03/2023, 1:13 PM

## 2023-03-03 NOTE — Progress Notes (Addendum)
Katrina Armstrong  ZOX:[id number] DOB: Dec 28, 1928 DOA: 03/01/2023 PCP: Emiliano Dyer, FNP    Brief Narrative:  89 year old with a history of HTN, HLD, mild AoS, PAF not on anticoagulation, remote PE, remote NSTEMI, diverticulosis, hearing impairment, and Alzheimer's dementia who was sent to the ER 10/21 after she suffered an unwitnessed fall at her nursing home.  Report suggest this may have been a simple mechanical fall/trip.  After her fall she was hypertensive shaky and complained of back pain and was transported to the ER.  Vitals were stable at presentation but patient was found to have a sodium of 114.  3 months prior the patient sodium was confirmed to have been 119.  CT head in the ER noted a left parietal scalp hematoma without underlying fracture.  CT C-spine revealed no acute findings.  Plain film x-ray of the pelvis was negative for acute fracture.  While in the ER she suffered an episode of decreased responsiveness diaphoresis and bladder incontinence concerning for a seizure.  Goals of Care:   Code Status: Limited: Do not attempt resuscitation (DNR) -DNR-LIMITED -Do Not Intubate/DNI    DVT prophylaxis: SCDs Start: 03/02/23 0152   Interim Hx: Afebrile.  Vital signs stable.  Oxygen saturation 97% room air.  Resting comfortably in bed.  In good spirits.  Asking when she can go home.  Denies any new complaints.  Reports good oral intake.  Assessment & Plan:  Hyponatremia -acute on subacute Records indicate baseline sodium was 119 approximately 3 months ago - HCTZ discontinued and should not be resumed -was dosed with 3% saline bolus by PCCM after admission -monitoring trend without specific treatment for now  Recent Labs  Lab 03/02/23 0907 03/02/23 1322 03/02/23 1809 03/02/23 2049 03/03/23 0506  NA 126* 130* 130* 131* 128*    Possible seizure activity As noted above, occurring in ER -felt to be related to hyponatremia - monitor for now w/o further w/u unless  recurrs  Hypokalemia Supplement and follow -likely due to poor intake as well as use of diuretic chronically -diuretic discontinued  HTN with hypertensive urgency at admission Blood pressure now well-controlled  History of NSTEMI Asymptomatic  PAF not on anticoagulation Appears she does not require rate controlling medications chronically -heart rate well-controlled  History of remote PE  Alzheimer's dementia Continue usual medical therapies  Severe hearing impairment I am able to communicate effectively with the patient using a loud voice  Family Communication: No family present at time of exam Disposition: Anticipate discharge to SNF for a rehab stay within next 24-48 hours   Objective: Blood pressure (!) 142/61, pulse 69, temperature 97.6 F (36.4 C), temperature source Oral, resp. rate 15, weight 67 kg, SpO2 97%.  Intake/Output Summary (Last 24 hours) at 03/03/2023 0959 Last data filed at 03/03/2023 0500 Gross per 24 hour  Intake 0 ml  Output 950 ml  Net -950 ml   Filed Weights   03/02/23 0430 03/03/23 0421  Weight: 67.1 kg 67 kg    Examination: General: No acute respiratory distress Lungs: Clear to auscultation bilaterally without wheezes or crackles Cardiovascular: Regular rate and rhythm without murmur gallop or rub normal S1 and S2 Abdomen: Nontender, nondistended, soft, bowel sounds positive, no rebound, no ascites, no appreciable mass Extremities: No significant cyanosis, clubbing, or edema bilateral lower extremities  CBC: Recent Labs  Lab 03/01/23 1713 03/02/23 0907 03/03/23 0506  WBC 10.5 9.5 8.9  NEUTROABS 6.3  --   --   HGB 11.5* 11.9* 12.3  HCT 33.2* 34.3* 36.0  MCV 83.8 82.5 83.7  PLT 238 265 280   Basic Metabolic Panel: Recent Labs  Lab 03/01/23 2311 03/02/23 0118 03/02/23 0907 03/02/23 1322 03/02/23 1809 03/02/23 2049 03/03/23 0506  NA 119*   < > 126*   < > 130* 131* 128*  K 3.5  --  3.5  --   --   --  3.0*  CL 84*  --  89*   --   --   --  94*  CO2 18*  --  23  --   --   --  24  GLUCOSE 142*  --  84  --   --   --  80  BUN 11  --  7*  --   --   --  8  CREATININE 0.77  --  0.56  --   --   --  0.73  CALCIUM 8.8*  --  9.0  --   --   --  9.1  MG 1.7  --  1.8  --   --   --  2.1  PHOS  --   --  2.5  --   --   --  2.5   < > = values in this interval not displayed.   GFR: Estimated Creatinine Clearance: 39.7 mL/min (by C-G formula based on SCr of 0.73 mg/dL).   Scheduled Meds:  Chlorhexidine Gluconate Cloth  6 each Topical Daily   insulin aspart  0-9 Units Subcutaneous Q4H   potassium chloride SA  40 mEq Oral Once     LOS: 2 days   Lonia Blood, MD [facility] Office  [phone] Pager - Text Page per Amion  If 7PM-7AM, please contact night-coverage per Amion 03/03/2023, 9:59 AM

## 2023-03-03 NOTE — TOC Initial Note (Signed)
Transition of Care [facility]) - Initial/Assessment Note    Patient Details  Name: Katrina Armstrong MRN: [id number] Date of Birth: 12/11/1928  Transition of Care [facility]) CM/SW Contact:    Carley Hammed, LCSW Phone Number: 03/03/2023, 12:52 PM  Clinical Narrative:                 CSW noting pt is currently only oriented to self, called son to discuss PT recommendation. Son confirms pt is from [facility], and is agreeable to SNF placement. Pt has been to [facility] and [facility], he is agreeable to either. CSW to complete workup and fax pt out for bed offers. TOC will continue to follow for DC needs.   Expected Discharge Plan: Skilled Nursing Facility Barriers to Discharge: SNF Pending bed offer, Insurance Authorization, Continued Medical Work up   Patient Goals and CMS Choice Patient states their goals for this hospitalization and ongoing recovery are:: Pt disoriented and unable to participate in goal setting. CMS Medicare.gov Compare Post Acute Care list provided to:: Patient Represenative (must comment) Choice offered to / list presented to : Adult Children      Expected Discharge Plan and Services     Post Acute Care Choice: Skilled Nursing Facility Living arrangements for the past 2 months: Assisted Living Facility                                      Prior Living Arrangements/Services Living arrangements for the past 2 months: Assisted Living Facility Lives with:: Facility Resident Patient language and need for interpreter reviewed:: Yes Do you feel safe going back to the place where you live?: Yes      Need for Family Participation in Patient Care: Yes (Comment) Care giver support system in place?: Yes (comment)   Criminal Activity/Legal Involvement Pertinent to Current Situation/Hospitalization: No - Comment as needed  Activities of Daily Living      Permission Sought/Granted Permission sought to share information with : Family Supports Permission  granted to share information with : Yes, Verbal Permission Granted  Share Information with NAME: Jonny Ruiz     Permission granted to share info w Relationship: Son     Emotional Assessment Appearance:: Appears stated age Attitude/Demeanor/Rapport: Unable to Assess Affect (typically observed): Unable to Assess Orientation: : Oriented to Self Alcohol / Substance Use: Not Applicable Psych Involvement: No (comment)  Admission diagnosis:  Hypokalemia [E87.6] Hyponatremia [E87.1] Fall, initial encounter [W19.XXXA] Scalp hematoma, initial encounter [S00.03XA] Patient Active Problem List   Diagnosis Date Noted   Hyponatremia 03/01/2023   Suspected UTI 10/07/2022   Mild aortic regurgitation 11/26/2021   Moderate mitral regurgitation 11/26/2021   Pedal edema 11/26/2021   Alzheimer's dementia (HCC) 11/24/2021   Hyperlipidemia 11/24/2021   Wears dentures 11/24/2021   Closed Malgaigne fracture of pelvis (HCC) 11/24/2021   Diverticular disease of colon 11/24/2021   Residual hemorrhoidal skin tags 11/24/2021   Sacral fracture, closed (HCC) 11/24/2021   Second degree hemorrhoids 11/24/2021   Paroxysmal atrial fibrillation (HCC) 09/29/2021   Closed fracture of right pelvis (HCC) 09/22/2021   Allergic rhinitis 08/04/2021   Dysuria 06/11/2021   HTN (hypertension) 01/28/2020   Constipation 07/06/2018   Varicose vein of leg 01/23/2018   Plantar fasciitis of right foot 03/09/2017   Health care maintenance 01/13/2017   Trochanteric bursitis of right hip 01/01/2015   Medicare annual wellness visit, subsequent 12/20/2013   Advance care planning  12/20/2013   Benign paroxysmal positional vertigo 05/05/2013   Dysfunction of eustachian tube 05/05/2013   Fatigue 08/19/2012   GERD (gastroesophageal reflux disease) 08/19/2012   Hyperglycemia 03/28/2012   Vulvar lesion (Seborrheic keratosis) 07/01/2011   Shoulder pain 06/05/2011   Pulmonary embolism (HCC) 12/21/2010   NSTEMI (non-ST elevated  myocardial infarction) (HCC) 12/08/2010   HLD (hyperlipidemia) 05/19/2010   Osteoporosis 05/19/2010   Memory loss 05/19/2010   DIVERTICULITIS, HX OF 05/19/2010   History of cardiovascular stress test 06/05/2009   PCP:  Emiliano Dyer, FNP Pharmacy:   [facility] [phone] - [place], [place] - [address] [place] [address] Phone: [phone] Fax: [phone]     Social Determinants of Health (SDOH) Social History: SDOH Screenings   Food Insecurity: No Food Insecurity (01/23/2020)  Housing: Low Risk  (01/23/2020)  Transportation Needs: No Transportation Needs (01/23/2020)  Alcohol Screen: Low Risk  (01/23/2020)  Depression (PHQ2-9): Low Risk  (08/24/2022)  Financial Resource Strain: Low Risk  (01/23/2020)  Physical Activity: Sufficiently Active (01/23/2020)  Stress: No Stress Concern Present (01/23/2020)  Tobacco Use: Medium Risk (03/01/2023)   SDOH Interventions:     Readmission Risk Interventions     No data to display

## 2023-03-03 NOTE — Plan of Care (Signed)
?  Problem: Clinical Measurements: ?Goal: Ability to maintain clinical measurements within normal limits will improve ?Outcome: Progressing ?Goal: Will remain free from infection ?Outcome: Progressing ?Goal: Diagnostic test results will improve ?Outcome: Progressing ?  ?

## 2023-03-03 NOTE — Evaluation (Signed)
Physical Therapy Evaluation Patient Details Name: Katrina Armstrong MRN: [id number] DOB: Dec 26, 1928 Today's Date: 03/03/2023  History of Present Illness  89 year old woman who presented to [facility] ED 10/21 after an unwitnessed fall at her nursing home [facility] [place]). PMHx significant for HTN, HLD, mild AS, PAF (not on [facility]), PE (12/2010), NSTEMI (2012, on Plavix), GERD, diverticular disease, Alzheimer's dementia, severe HOH with Eustachian tube dysfunction. Seen at PCP's office in 11/2022 for confusion and more frequent falls, found to have Na 119.  Clinical Impression  Son in room at beginning of session and provides PLOF PTA pt living in [facility], where she was walking with RW in hallway, and required some assistance with ADLs, facility provided meals and cleaning services. Son also reports pt was using Rollator, prior to RW and need to switch due to numerous falls. Pt is currently limited by poor safety awareness, poor knowledge of DME, generalized weakness and decreased balance. Pt is able to get to EoB with contact guard, but requires heavy modA for pivot transfers to [facility] and recliner. Patient will benefit from continued inpatient follow up therapy, <3 hours/day. PT will continue to follow acutely.       If plan is discharge home, recommend the following: A lot of help with walking and/or transfers;A lot of help with bathing/dressing/bathroom;Assistance with cooking/housework;Assistance with feeding;Direct supervision/assist for medications management;Direct supervision/assist for financial management;Assist for transportation;Help with stairs or ramp for entrance;Supervision due to cognitive status   Can travel by private vehicle   No    Equipment Recommendations    Recommendations for Other Services  OT consult    Functional Status Assessment Patient has had a recent decline in their functional status and demonstrates the ability to make significant improvements in function in a  reasonable and predictable amount of time.     Precautions / Restrictions Precautions Precautions: Fall Precaution Comments: hx of falls Restrictions Weight Bearing Restrictions: No      Mobility  Bed Mobility Overal bed mobility: Needs Assistance Bed Mobility: Supine to Sit     Supine to sit: Contact guard     General bed mobility comments: increased time and effort to get to EoB but no outside assist required    Transfers Overall transfer level: Needs assistance Equipment used: Rolling walker (2 wheels), 1 person hand held assist Transfers: Sit to/from Stand, Bed to chair/wheelchair/BSC Sit to Stand: Mod assist   Step pivot transfers: Mod assist       General transfer comment: Pt reports need to urinate, pt mod A for power up to RW for transfer to [facility], unable to bring CoG over Bos so sat back down and provided face to face step pivot transfer to [facility], sat there for awhile with no sucess, attempted RW again to get to recliner, pt able to power up increased flexed posture and starts stepping to recliner but lets go of RW before she has fully pivoted needs heavy mod A and directions to stand back at RW and finish pivot to recliner.    Ambulation/Gait               General Gait Details: unable      Balance Overall balance assessment: Needs assistance Sitting-balance support: No upper extremity supported, Bilateral upper extremity supported, Single extremity supported, Feet supported Sitting balance-Leahy Scale: Fair     Standing balance support: Bilateral upper extremity supported, During functional activity, Reliant on assistive device for balance Standing balance-Leahy Scale: Poor Standing balance comment: requires UE support  Pertinent Vitals/Pain Pain Assessment Pain Assessment: No/denies pain    Home Living Family/patient expects to be discharged to:: Assisted living                 Home Equipment:  Agricultural consultant (2 wheels);Rollator (4 wheels)      Prior Function Prior Level of Function : Needs assist             Mobility Comments: walking independently in ALF with RW, ADLs Comments: facility provides meals and cleaning pt receiving some help for ADLs     Extremity/Trunk Assessment   Upper Extremity Assessment Upper Extremity Assessment: Defer to OT evaluation    Lower Extremity Assessment Lower Extremity Assessment: Generalized weakness    Cervical / Trunk Assessment Cervical / Trunk Assessment: Kyphotic  Communication   Communication Communication: Hearing impairment  Cognition Arousal: Alert Behavior During Therapy: WFL for tasks assessed/performed Overall Cognitive Status: History of cognitive impairments - at baseline                                 General Comments: very hard of hearing, deflects cognitive decline with humor        General Comments General comments (skin integrity, edema, etc.): Son present during session and provides information about PLOF but leaves before mobilization VSS on RA        Assessment/Plan    PT Assessment Patient needs continued PT services  PT Problem List Decreased strength;Decreased activity tolerance;Decreased balance;Decreased mobility;Decreased knowledge of use of DME;Decreased safety awareness       PT Treatment Interventions DME instruction;Gait training;Functional mobility training;Therapeutic activities;Therapeutic exercise;Balance training;Cognitive remediation;Patient/family education    PT Goals (Current goals can be found in the Care Plan section)  Acute Rehab PT Goals PT Goal Formulation: With patient Time For Goal Achievement: 03/17/23 Potential to Achieve Goals: Fair    Frequency Min 1X/week        AM-PAC PT "6 Clicks" Mobility  Outcome Measure Help needed turning from your back to your side while in a flat bed without using bedrails?: None Help needed moving from lying on  your back to sitting on the side of a flat bed without using bedrails?: None Help needed moving to and from a bed to a chair (including a wheelchair)?: A Lot Help needed standing up from a chair using your arms (e.g., wheelchair or bedside chair)?: A Lot Help needed to walk in hospital room?: Total Help needed climbing 3-5 steps with a railing? : Total 6 Click Score: 14    End of Session Equipment Utilized During Treatment: Gait belt Activity Tolerance: Patient tolerated treatment well Patient left: in chair;with call bell/phone within reach;with chair alarm set Nurse Communication: Mobility status PT Visit Diagnosis: Unsteadiness on feet (R26.81);Other abnormalities of gait and mobility (R26.89);Muscle weakness (generalized) (M62.81);History of falling (Z91.81);Repeated falls (R29.6);Difficulty in walking, not elsewhere classified (R26.2)    Time: 1610-9604 PT Time Calculation (min) (ACUTE ONLY): 40 min   Charges:   PT Evaluation $PT Eval Moderate Complexity: 1 Mod PT Treatments $Therapeutic Activity: 23-37 mins PT General Charges $$ ACUTE PT VISIT: 1 Visit         Bahja Bence B. Beverely Risen PT, DPT Acute Rehabilitation Services Please use secure chat or  Call Office [phone]   Elon Alas [facility] 03/03/2023, 10:54 AM

## 2023-03-03 NOTE — NC FL2 (Signed)
[place] MEDICAID FL2 LEVEL OF CARE FORM     IDENTIFICATION  Patient Name: Katrina Armstrong Birthdate: 16-Apr-1929 Sex: female Admission Date (Current Location): 03/01/2023  [facility] and [place] Number:  Producer, television/film/video and Address:  [facility], [address] [place], [place] [address]      Provider Number: [id number]  Attending Physician Name and Address:  Lonia Blood, MD  Relative Name and Phone Number:       Current Level of Care: Hospital Recommended Level of Care: Skilled Nursing Facility Prior Approval Number:    Date Approved/Denied:   PASRR Number: [id number]  Discharge Plan: SNF    Current Diagnoses: Patient Active Problem List   Diagnosis Date Noted   Hyponatremia 03/01/2023   Suspected UTI 10/07/2022   Mild aortic regurgitation 11/26/2021   Moderate mitral regurgitation 11/26/2021   Pedal edema 11/26/2021   Alzheimer's dementia (HCC) 11/24/2021   Hyperlipidemia 11/24/2021   Wears dentures 11/24/2021   Closed Malgaigne fracture of pelvis (HCC) 11/24/2021   Diverticular disease of colon 11/24/2021   Residual hemorrhoidal skin tags 11/24/2021   Sacral fracture, closed (HCC) 11/24/2021   Second degree hemorrhoids 11/24/2021   Paroxysmal atrial fibrillation (HCC) 09/29/2021   Closed fracture of right pelvis (HCC) 09/22/2021   Allergic rhinitis 08/04/2021   Dysuria 06/11/2021   HTN (hypertension) 01/28/2020   Constipation 07/06/2018   Varicose vein of leg 01/23/2018   Plantar fasciitis of right foot 03/09/2017   Health care maintenance 01/13/2017   Trochanteric bursitis of right hip 01/01/2015   Medicare annual wellness visit, subsequent 12/20/2013   Advance care planning 12/20/2013   Benign paroxysmal positional vertigo 05/05/2013   Dysfunction of eustachian tube 05/05/2013   Fatigue 08/19/2012   GERD (gastroesophageal reflux disease) 08/19/2012   Hyperglycemia 03/28/2012   Vulvar lesion (Seborrheic keratosis)  07/01/2011   Shoulder pain 06/05/2011   Pulmonary embolism (HCC) 12/21/2010   NSTEMI (non-ST elevated myocardial infarction) (HCC) 12/08/2010   HLD (hyperlipidemia) 05/19/2010   Osteoporosis 05/19/2010   Memory loss 05/19/2010   DIVERTICULITIS, HX OF 05/19/2010   History of cardiovascular stress test 06/05/2009    Orientation RESPIRATION BLADDER Height & Weight     Self  Normal Incontinent Weight: 147 lb 11.3 oz (67 kg) Height:     BEHAVIORAL SYMPTOMS/MOOD NEUROLOGICAL BOWEL NUTRITION STATUS      Continent Diet (See Dc summary)  AMBULATORY STATUS COMMUNICATION OF NEEDS Skin   Extensive Assist Verbally Normal                       Personal Care Assistance Level of Assistance  Bathing, Feeding, Dressing Bathing Assistance: Maximum assistance Feeding assistance: Limited assistance Dressing Assistance: Maximum assistance     Functional Limitations Info  Sight, Hearing, Speech Sight Info: Adequate Hearing Info: Impaired Speech Info: Adequate    SPECIAL CARE FACTORS FREQUENCY  PT (By licensed PT), OT (By licensed OT)     PT Frequency: 5x week OT Frequency: 5x week            Contractures Contractures Info: Not present    Additional Factors Info  Code Status, Allergies, Psychotropic Code Status Info: DNR Allergies Info: Boniva (Ibandronate Sodium),  Raloxifene Psychotropic Info: Donepezil         Current Medications (03/03/2023):  This is the current hospital active medication list Current Facility-Administered Medications  Medication Dose Route Frequency Provider Last Rate Last Admin   acetaminophen (TYLENOL) tablet 650 mg  650  mg Oral Q6H PRN Lonia Blood, MD   650 mg at 03/03/23 1228   Chlorhexidine Gluconate Cloth 2 % PADS 6 each  6 each Topical Daily Lynnell Catalan, MD   6 each at 03/03/23 0916   cyanocobalamin (VITAMIN B12) tablet 1,000 mcg  1,000 mcg Oral Daily Lonia Blood, MD   1,000 mcg at 03/03/23 1228   docusate sodium (COLACE)  capsule 100 mg  100 mg Oral BID PRN Cloyd Stagers M, PA-C       donepezil (ARICEPT) tablet 10 mg  10 mg Oral Daily Jetty Duhamel T, MD   10 mg at 03/03/23 1228   ondansetron (ZOFRAN) injection 4 mg  4 mg Intravenous Q6H PRN Lonia Blood, MD       polyethylene glycol (MIRALAX / GLYCOLAX) packet 17 g  17 g Oral Daily PRN Cloyd Stagers M, PA-C       potassium chloride SA (KLOR-CON M) CR tablet 40 mEq  40 mEq Oral BID Lonia Blood, MD   40 mEq at 03/03/23 1228     Discharge Medications: Please see discharge summary for a list of discharge medications.  Relevant Imaging Results:  Relevant Lab Results:   Additional Information SS# [id number] [place]

## 2023-03-04 DIAGNOSIS — E871 Hypo-osmolality and hyponatremia: Secondary | ICD-10-CM | POA: Diagnosis not present

## 2023-03-04 LAB — BASIC METABOLIC PANEL
Anion gap: 6 (ref 5–15)
BUN: 11 mg/dL (ref 8–23)
CO2: 26 mmol/L (ref 22–32)
Calcium: 9.5 mg/dL (ref 8.9–10.3)
Chloride: 100 mmol/L (ref 98–111)
Creatinine, Ser: 0.87 mg/dL (ref 0.44–1.00)
GFR, Estimated: >60 mL/min (ref >60)
Glucose, Bld: 127 mg/dL — ABNORMAL HIGH (ref 70–99)
Potassium: 4.3 mmol/L (ref 3.5–5.1)
Sodium: 132 mmol/L — ABNORMAL LOW (ref 135–145)

## 2023-03-04 LAB — GLUCOSE, CAPILLARY
Glucose-Capillary: 89 mg/dL (ref 70–99)
Glucose-Capillary: 143 mg/dL — ABNORMAL HIGH (ref 70–99)
Glucose-Capillary: 110 mg/dL — ABNORMAL HIGH (ref 70–99)
Glucose-Capillary: 130 mg/dL — ABNORMAL HIGH (ref 70–99)

## 2023-03-04 LAB — MAGNESIUM: Magnesium: 2.1 mg/dL (ref 1.7–2.4)

## 2023-03-04 NOTE — Care Management Important Message (Signed)
Important Message  Patient Details  Name: MAYSEN BLACKWELDER MRN: [id number] Date of Birth: 09-24-1928   Important Message Given:  Yes - Medicare IM     Sherilyn Banker 03/04/2023, 11:49 AM

## 2023-03-04 NOTE — Progress Notes (Signed)
Katrina Armstrong  WGN:[id number] DOB: [birth year] DOA: 03/01/2023 PCP: Emiliano Dyer, FNP    Brief Narrative:  89 year old with a history of HTN, HLD, mild AoS, PAF not on anticoagulation, remote PE, remote NSTEMI, diverticulosis, hearing impairment, and Alzheimer's dementia who was sent to the ER 10/21 after she suffered an unwitnessed fall at her nursing home.  Report suggest this may have been a simple mechanical fall/trip.  After her fall she was hypertensive shaky and complained of back pain and was transported to the ER.  Vitals were stable at presentation but patient was found to have a sodium of 114.  3 months prior the patient sodium was confirmed to have been 119.  CT head in the ER noted a left parietal scalp hematoma without underlying fracture.  CT C-spine revealed no acute findings.  Plain film x-ray of the pelvis was negative for acute fracture.  While in the ER she suffered an episode of decreased responsiveness diaphoresis and bladder incontinence concerning for a seizure.  Goals of Care:   Code Status: Limited: Do not attempt resuscitation (DNR) -DNR-LIMITED -Do Not Intubate/DNI    DVT prophylaxis: SCDs Start: 03/02/23 0152   Interim Hx: No acute events reported overnight.  Afebrile.  Vital signs stable.  Sodium stable/improving at 132 today.  In good spirits.  Quite pleasant.  No new complaints.  Is anxious to be discharged.  Assessment & Plan:  Hyponatremia -acute on subacute Records indicate baseline sodium was 119 approximately 3 months ago - HCTZ discontinued and should not be resumed -was dosed with 3% saline bolus by PCCM after admission -monitoring trend without specific treatment for now, with sodium steadily improving simply in absence of HCTZ  Recent Labs  Lab 03/02/23 1322 03/02/23 1809 03/02/23 2049 03/03/23 0506 03/04/23 0857  NA 130* 130* 131* 128* 132*    Possible seizure activity As noted above, occurring in ER -felt to be related to hyponatremia  - monitor for now w/o further w/u unless recurrs  Hypokalemia Supplemented -likely due to poor intake as well as use of diuretic chronically -diuretic discontinued -potassium now stable  HTN with hypertensive urgency at admission Blood pressure now well-controlled  History of NSTEMI Asymptomatic  PAF not on anticoagulation Appears she does not require rate controlling medications chronically -heart rate well-controlled  History of remote PE  Alzheimer's dementia Continue usual medical therapies  Severe hearing impairment I am able to communicate effectively with the patient using a loud voice  Family Communication: No family present at time of exam Disposition: Now medically ready for discharge to SNF when bed available   Objective: Blood pressure 139/65, pulse 73, temperature 98 F (36.7 C), temperature source Oral, resp. rate 18, weight 69.3 kg, SpO2 96%.  Intake/Output Summary (Last 24 hours) at 03/04/2023 1018 Last data filed at 03/04/2023 0500 Gross per 24 hour  Intake --  Output 1000 ml  Net -1000 ml   Filed Weights   03/02/23 0430 03/03/23 0421 03/04/23 0500  Weight: 67.1 kg 67 kg 69.3 kg    Examination: General: No acute respiratory distress Lungs: Clear to auscultation bilaterally without wheezes or crackles Cardiovascular: RRR without murmur Abdomen: NT/ND, soft, BS positive Extremities: No significant cyanosis, clubbing, or edema bilateral lower extremities  CBC: Recent Labs  Lab 03/01/23 1713 03/02/23 0907 03/03/23 0506  WBC 10.5 9.5 8.9  NEUTROABS 6.3  --   --   HGB 11.5* 11.9* 12.3  HCT 33.2* 34.3* 36.0  MCV 83.8 82.5 83.7  PLT 238  265 280   Basic Metabolic Panel: Recent Labs  Lab 03/02/23 0907 03/02/23 1322 03/02/23 2049 03/03/23 0506 03/04/23 0857  NA 126*   < > 131* 128* 132*  K 3.5  --   --  3.0* 4.3  CL 89*  --   --  94* 100  CO2 23  --   --  24 26  GLUCOSE 84  --   --  80 127*  BUN 7*  --   --  8 11  CREATININE 0.56  --    --  0.73 0.87  CALCIUM 9.0  --   --  9.1 9.5  MG 1.8  --   --  2.1 2.1  PHOS 2.5  --   --  2.5  --    < > = values in this interval not displayed.   GFR: Estimated Creatinine Clearance: 40 mL/min (by C-G formula based on SCr of 0.87 mg/dL).   Scheduled Meds:  Chlorhexidine Gluconate Cloth  6 each Topical Daily   cyanocobalamin  1,000 mcg Oral Daily   donepezil  10 mg Oral Daily     LOS: 3 days   Lonia Blood, MD [facility] Office  [phone] Pager - Text Page per Amion  If 7PM-7AM, please contact night-coverage per Amion 03/04/2023, 10:18 AM

## 2023-03-04 NOTE — Plan of Care (Signed)

## 2023-03-04 NOTE — Progress Notes (Signed)
   03/04/23 1517  Mobility  Activity Ambulated with assistance in room  Level of Assistance Moderate assist, patient does 50-74%  Assistive Device Front wheel walker  Distance Ambulated (ft) 20 ft  Activity Response Tolerated well  Mobility Referral Yes  $Mobility charge 1 Mobility  Mobility Specialist Start Time (ACUTE ONLY) 1446  Mobility Specialist Stop Time (ACUTE ONLY) 1501  Mobility Specialist Time Calculation (min) (ACUTE ONLY) 15 min   Mobility Specialist: Progress Note  Pt agreeable to mobility session - received in chair. Required ModA using RW with a flexed trunk. LOB x3, recovery with ModA by MS.  C/o "wanting to know if her son knows about her going to rehab." Returned to bed with all needs met - call bell within reach. Bed alarm on. Left with NT present.   Barnie Mort, BS Mobility Specialist Please contact via SecureChat or Rehab office at [phone].

## 2023-03-04 NOTE — TOC Progression Note (Addendum)
Transition of Care [facility]) - Progression Note    Patient Details  Name: Katrina Armstrong MRN: [id number] Date of Birth: Nov 18, 1928  [facility]) CM/SW Contact  Carley Hammed, LCSW Phone Number: 03/04/2023, 10:11 AM  Clinical Narrative:    CSW provided bed offers to pt's son, he chose [facility], facility notified. Discharge unknown at this time, will require an insurance auth. Son had medical questions, these were passed on to the nurse to update pt. TOC will continue to follow.   10:45 authorization pending at this time.  1:30 Authorization approved, bed available at facility tomorrow.  Expected Discharge Plan: Skilled Nursing Facility Barriers to Discharge: SNF Pending bed offer, Insurance Authorization, Continued Medical Work up  Expected Discharge Plan and Services     Post Acute Care Choice: Skilled Nursing Facility Living arrangements for the past 2 months: Assisted Living Facility                                       Social Determinants of Health (SDOH) Interventions SDOH Screenings   Food Insecurity: No Food Insecurity (01/23/2020)  Housing: Low Risk  (01/23/2020)  Transportation Needs: No Transportation Needs (01/23/2020)  Alcohol Screen: Low Risk  (01/23/2020)  Depression (PHQ2-9): Low Risk  (08/24/2022)  Financial Resource Strain: Low Risk  (01/23/2020)  Physical Activity: Sufficiently Active (01/23/2020)  Stress: No Stress Concern Present (01/23/2020)  Tobacco Use: Medium Risk (03/01/2023)    Readmission Risk Interventions     No data to display

## 2023-03-04 NOTE — Progress Notes (Addendum)
   03/04/23 1226  Mobility  Activity Transferred from bed to chair  Level of Assistance Moderate assist, patient does 50-74%  Assistive Device Front wheel walker  Distance Ambulated (ft) 5 ft  Activity Response Tolerated well  $Mobility charge 1 Mobility  Mobility Specialist Start Time (ACUTE ONLY) 1157  Mobility Specialist Stop Time (ACUTE ONLY) 1220  Mobility Specialist Time Calculation (min) (ACUTE ONLY) 23 min   Mobility Specialist: Progress Note  Pt agreeable to mobility session - received in bed. Required ModA using RW. Pt with no complaints. Returned to chair with all needs met - call bell within reach. Chair alarm on.   Barnie Mort, BS Mobility Specialist Please contact via SecureChat or Rehab office at [phone].

## 2023-03-05 DIAGNOSIS — E871 Hypo-osmolality and hyponatremia: Secondary | ICD-10-CM | POA: Diagnosis not present

## 2023-03-05 LAB — BASIC METABOLIC PANEL
Anion gap: 8 (ref 5–15)
BUN: 9 mg/dL (ref 8–23)
CO2: 26 mmol/L (ref 22–32)
Calcium: 9.2 mg/dL (ref 8.9–10.3)
Chloride: 99 mmol/L (ref 98–111)
Creatinine, Ser: 0.88 mg/dL (ref 0.44–1.00)
GFR, Estimated: >60 mL/min (ref >60)
Glucose, Bld: 155 mg/dL — ABNORMAL HIGH (ref 70–99)
Potassium: 4.3 mmol/L (ref 3.5–5.1)
Sodium: 133 mmol/L — ABNORMAL LOW (ref 135–145)

## 2023-03-05 LAB — GLUCOSE, CAPILLARY: Glucose-Capillary: 102 mg/dL — ABNORMAL HIGH (ref 70–99)

## 2023-03-05 MED ORDER — AMLODIPINE BESYLATE 10 MG PO TABS: 10.0000 mg | ORAL_TABLET | Freq: Every day | ORAL | Status: AC

## 2023-03-05 MED ORDER — ACETAMINOPHEN 325 MG PO TABS: 650.0000 mg | ORAL_TABLET | Freq: Four times a day (QID) | ORAL | Status: AC | PRN

## 2023-03-05 MED ORDER — AMLODIPINE BESYLATE 10 MG PO TABS: 10.0000 mg | ORAL_TABLET | Freq: Two times a day (BID) | ORAL | Status: DC

## 2023-03-05 NOTE — TOC Transition Note (Signed)
Transition of Care [facility]) - CM/SW Discharge Note   Patient Details  Name: Katrina Armstrong MRN: [id number] Date of Birth: 09-09-1928  Transition of [facility]) CM/SW Contact:  Carley Hammed, LCSW Phone Number: 03/05/2023, 11:03 AM   Clinical Narrative:    Pt to be transported to [facility] via [facility]. Nurse to call report to [phone]. Rm# [phone]   Final next level of care: Skilled Nursing Facility Barriers to Discharge: Barriers Resolved   Patient Goals and CMS Choice CMS Medicare.gov Compare Post Acute Care list provided to:: Patient Represenative (must comment) Choice offered to / list presented to : Adult Children  Discharge Placement                Patient chooses bed at: [facility] [place] Patient to be transferred to facility by: [facility] Name of family member notified: John Patient and family notified of of transfer: 03/05/23  Discharge Plan and Services Additional resources added to the After Visit Summary for       Post Acute Care Choice: Skilled Nursing Facility                               Social Determinants of Health (SDOH) Interventions SDOH Screenings   Food Insecurity: No Food Insecurity (01/23/2020)  Housing: Low Risk  (01/23/2020)  Transportation Needs: No Transportation Needs (01/23/2020)  Alcohol Screen: Low Risk  (01/23/2020)  Depression (PHQ2-9): Low Risk  (08/24/2022)  Financial Resource Strain: Low Risk  (01/23/2020)  Physical Activity: Sufficiently Active (01/23/2020)  Stress: No Stress Concern Present (01/23/2020)  Tobacco Use: Medium Risk (03/01/2023)     Readmission Risk Interventions     No data to display

## 2023-03-05 NOTE — Discharge Summary (Signed)
DISCHARGE SUMMARY  REILEY UPRIGHT  MR#: [id number]  DOB:10-Jul-1928  Date of Admission: 03/01/2023 Date of Discharge: 03/05/2023  Attending Physician:Jamilynn Whitacre Silvestre Gunner, MD  Patient's ONG:EXBMWU, Curt Bears, FNP  Disposition: D/C to SNF   Follow-up Appts:  Contact information for follow-up providers     Emiliano Dyer, FNP Follow up in 4 day(s).   Specialty: Family Medicine Contact information: [address] [place] [address] [phone]              Contact information for after-discharge care     Destination     [facility] [place] Preferred SNF .   Service: Skilled Nursing Contact information: [address] [place] [address] [phone]                     Tests Needing Follow-up: -recheck of Na+ and K+ levels in 3-5 days is suggested   Discharge Diagnoses: Hyponatremia -acute on subacute  Possible seizure activity Hypokalemia HTN with hypertensive urgency at admission History of NSTEMI PAF not on anticoagulation History of remote PE Alzheimer's dementia Severe hearing impairment   Initial presentation: 89 year old with a history of HTN, HLD, mild AoS, PAF not on anticoagulation, remote PE, remote NSTEMI, diverticulosis, hearing impairment, and Alzheimer's dementia who was sent to the ER 10/21 after she suffered an unwitnessed fall at her nursing home. Report suggest this may have been a simple mechanical fall/trip. After her fall she was hypertensive, shaky, and complained of back pain and was transported to the ER. Vitals were stable at presentation but patient was found to have a sodium of 114. 3 months prior the patient sodium was confirmed to have been 119. CT head in the ER noted a left parietal scalp hematoma without underlying fracture. CT C-spine revealed no acute findings. Plain film x-ray of the pelvis was negative for acute fracture. While in the ER she suffered an episode of decreased  responsiveness diaphoresis and bladder incontinence concerning for a seizure.   Hospital Course:  Hyponatremia -acute on subacute Records indicate baseline sodium was 119 approximately 3 months ago - HCTZ discontinued and should not be resumed - was dosed with 3% saline bolus by PCCM after admission - sodium steadily improving simply in absence of hydrochlorothiazide - clinically stable    Recent Labs  Lab 03/02/23 1322 03/02/23 1809 03/02/23 2049 03/03/23 0506 03/04/23 0857  NA 130* 130* 131* 128* 132*        Possible seizure activity As noted above, occurring in ER - felt to be related to hyponatremia - monitor for now w/o further w/u unless recurrs   Hypokalemia Supplemented - likely due to poor intake as well as use of diuretic chronically -diuretic discontinued -potassium now stable   HTN with hypertensive urgency at admission Blood pressure well-controlled   History of NSTEMI Asymptomatic   PAF not on anticoagulation Appears she does not require rate controlling medications chronically -heart rate well-controlled   History of remote PE   Alzheimer's dementia Continue usual medical therapies   Severe hearing impairment I am able to communicate effectively with the patient using a loud voice    Allergies as of 03/05/2023       Reactions   Boniva [ibandronate Sodium]    Lack of effect   Raloxifene    Stopped due to DVT/PE 12/2010        Medication List     STOP taking these medications    losartan 50 MG tablet Commonly  known as: COZAAR       TAKE these medications    acetaminophen 325 MG tablet Commonly known as: TYLENOL Take 2 tablets (650 mg total) by mouth every 6 (six) hours as needed for mild pain (pain score 1-3), fever or headache. What changed:  medication strength how much to take when to take this   amLODipine 10 MG tablet Commonly known as: NORVASC Take 1 tablet (10 mg total) by mouth daily. What changed:  medication  strength how much to take when to take this   cyanocobalamin 1000 MCG tablet Take 1,000 mcg by mouth daily.   donepezil 10 MG tablet Commonly known as: ARICEPT TAKE 1 TABLET BY MOUTH EVERY DAY   MULTIVITAMIN ADULTS 50+ PO Take by mouth daily.   polyethylene glycol powder 17 GM/SCOOP powder Commonly known as: GLYCOLAX/MIRALAX Take 17 g by mouth daily as needed for mild constipation.        Day of Discharge BP (!) 144/64 (BP Location: Left Arm)   Pulse 67   Temp 98 F (36.7 C) (Oral)   Resp 18   Wt 69.3 kg   SpO2 96%   BMI 25.74 kg/m   Physical Exam: General: No acute respiratory distress Lungs: Clear to auscultation bilaterally without wheezes or crackles Cardiovascular: Regular rate and rhythm without murmur gallop or rub normal S1 and S2 Abdomen: Nontender, nondistended, soft, bowel sounds positive, no rebound, no ascites, no appreciable mass Extremities: No significant cyanosis, clubbing, or edema bilateral lower extremities  Basic Metabolic Panel: Recent Labs  Lab 03/01/23 1713 03/01/23 2311 03/02/23 0118 03/02/23 0907 03/02/23 1322 03/02/23 1809 03/02/23 2049 03/03/23 0506 03/04/23 0857  NA 114* 119*   < > 126* 130* 130* 131* 128* 132*  K 3.3* 3.5  --  3.5  --   --   --  3.0* 4.3  CL 81* 84*  --  89*  --   --   --  94* 100  CO2 20* 18*  --  23  --   --   --  24 26  GLUCOSE 122* 142*  --  84  --   --   --  80 127*  BUN 13 11  --  7*  --   --   --  8 11  CREATININE 0.79 0.77  --  0.56  --   --   --  0.73 0.87  CALCIUM 8.6* 8.8*  --  9.0  --   --   --  9.1 9.5  MG  --  1.7  --  1.8  --   --   --  2.1 2.1  PHOS  --   --   --  2.5  --   --   --  2.5  --    < > = values in this interval not displayed.    CBC: Recent Labs  Lab 03/01/23 1713 03/02/23 0907 03/03/23 0506  WBC 10.5 9.5 8.9  NEUTROABS 6.3  --   --   HGB 11.5* 11.9* 12.3  HCT 33.2* 34.3* 36.0  MCV 83.8 82.5 83.7  PLT 238 265 280    Time spent in discharge (includes decision making  & examination of pt): 35 minutes  03/05/2023, 9:48 AM   Lonia Blood, MD [facility] Office  [phone]

## 2023-03-05 NOTE — Plan of Care (Signed)
  Problem: Pain Managment: Goal: General experience of comfort will improve Outcome: Progressing   Problem: Safety: Goal: Ability to remain free from injury will improve Outcome: Progressing   

## 2023-03-22 ENCOUNTER — Telehealth: Payer: Self-pay

## 2023-03-22 NOTE — Transitions of Care (Post Inpatient/ED Visit) (Unsigned)
   03/22/2023  Name: Katrina Armstrong MRN: [id number] DOB: 1929/02/25  Today's TOC FU Call Status: Today's TOC FU Call Status:: Unsuccessful Call (1st Attempt) Unsuccessful Call (1st Attempt) Date: 03/22/23  Attempted to reach the patient regarding the most recent Inpatient/ED visit.  Follow Up Plan: Additional outreach attempts will be made to reach the patient to complete the Transitions of Care (Post Inpatient/ED visit) call.   Signature Agnes Lawrence, CMA (AAMA)  [facility] [phone]

## 2023-03-23 ENCOUNTER — Telehealth: Payer: Self-pay | Admitting: Family Medicine

## 2023-03-23 NOTE — Telephone Encounter (Signed)
Home Health verbal orders Caller Name: nenita  Agency Name: [employer] number: [phone], secured   Requesting PT  Reason:  Frequency: 2 week 1 & 1 week 7   Please forward to [facility] pool or providers CMA

## 2023-03-23 NOTE — Telephone Encounter (Signed)
Called and spoke with Nenita to advise of Dr. Para March orders

## 2023-03-23 NOTE — Telephone Encounter (Signed)
Please give the order.  Thanks.   

## 2023-08-15 ENCOUNTER — Other Ambulatory Visit: Payer: Self-pay | Admitting: Family Medicine

## 2023-08-15 DIAGNOSIS — M810 Age-related osteoporosis without current pathological fracture: Secondary | ICD-10-CM

## 2023-08-15 DIAGNOSIS — I10 Essential (primary) hypertension: Secondary | ICD-10-CM

## 2023-08-23 ENCOUNTER — Other Ambulatory Visit: Payer: Medicare Other

## 2023-08-30 ENCOUNTER — Encounter: Payer: Medicare Other | Admitting: Family Medicine
